# Patient Record
Sex: Female | Born: 1935 | Race: White | Hispanic: No | Marital: Married | Smoking: Never smoker
Health system: Southern US, Community
[De-identification: ages and names within clinical notes are randomized; demographics above are authoritative.]

## PROBLEM LIST (undated history)

## (undated) DIAGNOSIS — T8859XA Other complications of anesthesia, initial encounter: Secondary | ICD-10-CM

## (undated) DIAGNOSIS — T4145XA Adverse effect of unspecified anesthetic, initial encounter: Secondary | ICD-10-CM

## (undated) DIAGNOSIS — R112 Nausea with vomiting, unspecified: Secondary | ICD-10-CM

## (undated) DIAGNOSIS — M1990 Unspecified osteoarthritis, unspecified site: Secondary | ICD-10-CM

## (undated) DIAGNOSIS — Z9889 Other specified postprocedural states: Secondary | ICD-10-CM

## (undated) HISTORY — PX: CHOLECYSTECTOMY: SHX55

## (undated) HISTORY — PX: ABDOMINAL HYSTERECTOMY: SHX81

## (undated) HISTORY — PX: KNEE ARTHROSCOPY: SUR90

## (undated) HISTORY — PX: SHOULDER ACROMIOPLASTY: SHX6093

## (undated) HISTORY — PX: APPENDECTOMY: SHX54

---

## 1999-04-26 ENCOUNTER — Encounter: Admission: RE | Admit: 1999-04-26 | Discharge: 1999-04-26 | Payer: Self-pay | Admitting: Family Medicine

## 1999-04-26 ENCOUNTER — Encounter: Payer: Self-pay | Admitting: Family Medicine

## 1999-05-15 ENCOUNTER — Encounter: Admission: RE | Admit: 1999-05-15 | Discharge: 1999-05-15 | Payer: Self-pay | Admitting: Family Medicine

## 1999-05-15 ENCOUNTER — Encounter: Payer: Self-pay | Admitting: Family Medicine

## 2000-05-18 ENCOUNTER — Encounter: Admission: RE | Admit: 2000-05-18 | Discharge: 2000-05-18 | Payer: Self-pay | Admitting: Family Medicine

## 2000-05-18 ENCOUNTER — Encounter: Payer: Self-pay | Admitting: Family Medicine

## 2001-05-12 ENCOUNTER — Encounter: Admission: RE | Admit: 2001-05-12 | Discharge: 2001-05-12 | Payer: Self-pay | Admitting: Family Medicine

## 2001-05-12 ENCOUNTER — Encounter: Payer: Self-pay | Admitting: Family Medicine

## 2002-03-31 ENCOUNTER — Observation Stay (HOSPITAL_COMMUNITY): Admission: RE | Admit: 2002-03-31 | Discharge: 2002-04-01 | Payer: Self-pay | Admitting: Orthopedic Surgery

## 2002-06-10 ENCOUNTER — Encounter: Admission: RE | Admit: 2002-06-10 | Discharge: 2002-06-10 | Payer: Self-pay | Admitting: Family Medicine

## 2002-06-10 ENCOUNTER — Encounter: Payer: Self-pay | Admitting: Family Medicine

## 2002-06-23 ENCOUNTER — Encounter: Admission: RE | Admit: 2002-06-23 | Discharge: 2002-06-23 | Payer: Self-pay | Admitting: Family Medicine

## 2002-06-23 ENCOUNTER — Encounter: Payer: Self-pay | Admitting: Family Medicine

## 2002-06-28 ENCOUNTER — Encounter: Payer: Self-pay | Admitting: Family Medicine

## 2002-06-28 ENCOUNTER — Encounter: Admission: RE | Admit: 2002-06-28 | Discharge: 2002-06-28 | Payer: Self-pay | Admitting: Family Medicine

## 2003-07-10 ENCOUNTER — Encounter: Admission: RE | Admit: 2003-07-10 | Discharge: 2003-07-10 | Payer: Self-pay | Admitting: Family Medicine

## 2004-06-03 ENCOUNTER — Encounter: Admission: RE | Admit: 2004-06-03 | Discharge: 2004-06-03 | Payer: Self-pay | Admitting: Family Medicine

## 2004-07-22 ENCOUNTER — Encounter: Admission: RE | Admit: 2004-07-22 | Discharge: 2004-07-22 | Payer: Self-pay | Admitting: Family Medicine

## 2005-07-25 ENCOUNTER — Encounter: Admission: RE | Admit: 2005-07-25 | Discharge: 2005-07-25 | Payer: Self-pay | Admitting: Family Medicine

## 2006-09-01 ENCOUNTER — Encounter: Admission: RE | Admit: 2006-09-01 | Discharge: 2006-09-01 | Payer: Self-pay | Admitting: Family Medicine

## 2007-09-06 ENCOUNTER — Encounter: Admission: RE | Admit: 2007-09-06 | Discharge: 2007-09-06 | Payer: Self-pay | Admitting: Family Medicine

## 2007-09-14 ENCOUNTER — Encounter: Admission: RE | Admit: 2007-09-14 | Discharge: 2007-09-14 | Payer: Self-pay | Admitting: Family Medicine

## 2008-03-14 ENCOUNTER — Encounter: Admission: RE | Admit: 2008-03-14 | Discharge: 2008-03-14 | Payer: Self-pay | Admitting: Family Medicine

## 2008-09-12 ENCOUNTER — Encounter: Admission: RE | Admit: 2008-09-12 | Discharge: 2008-09-12 | Payer: Self-pay | Admitting: Family Medicine

## 2009-09-17 ENCOUNTER — Encounter: Admission: RE | Admit: 2009-09-17 | Discharge: 2009-09-17 | Payer: Self-pay | Admitting: Family Medicine

## 2010-07-26 NOTE — Op Note (Signed)
   Megan [name], Megan [name]                           ACCOUNT NO.:  [id number]   MEDICAL RECORD NO.:  [id number]                   PATIENT TYPE:  AMB   LOCATION:  DAY                                  FACILITY:  [facility]   PHYSICIAN:  Ronald A. Gioffre, M.D.             DATE OF BIRTH:  1927-10-25   DATE OF PROCEDURE:  03/31/2002  DATE OF DISCHARGE:                                 OPERATIVE REPORT   PREOPERATIVE DIAGNOSES:  1. Tear of the rotator cuff tendon on the left.  2. Severe impingement syndrome, left shoulder.   POSTOPERATIVE DIAGNOSES:  1. Tear of the rotator cuff tendon on the left.  2. Severe impingement syndrome, left shoulder.   PROCEDURES:  1. Open decompression by performing an acromionectomy and acromioplasty of     the left shoulder.  2. Repair of a small tear in the rotator cuff tendon, left shoulder.   SURGEON:  Georges Lynch. Darrelyn Hillock, M.D.   ASSISTANT:  Ebbie Ridge. Paitsel, P.A.   DESCRIPTION OF PROCEDURE:  Under general anesthesia, prior to the general  anesthesia the patient was given an interscalene block in the left side of  her neck in the holding area.  She was then taken back to surgery and under  general anesthesia, a routine orthopedic prep and draping of the left  shoulder was carried out.  At this time an incision was made over the  anterior aspect of the left shoulder, bleeders identified and cauterized.  I  then split the deltoid tendon and muscle by sharp dissection.  I excised the  subdeltoid bursa.  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  At this  time I noted a small hole in the rotator cuff tendon.  I protected the  tendon with the Bennett retractor and did an acromionectomy and  acromioplasty by utilizing the oscillating saw and the bur.  She had severe  overgrowth of her acromion, which was embedding down into the rotator cuff.  After this was done, we had a nice clear, open space.  I then put a few  sutures in the rotator cuff, repaired the  cuff, thoroughly irrigated out the  shoulder, then reapproximated the tendon and muscle in the usual fashion.  The subcu was closed with 0 Vicryl, the skin with metal staples, and a  sterile Neosporin dressing was applied.  She was then placed in a sling.                                               Ronald A. Darrelyn Hillock, M.D.    RAG/MEDQ  D:  03/31/2002  T:  03/31/2002  Job:  [id number]

## 2010-09-06 ENCOUNTER — Other Ambulatory Visit: Payer: Self-pay | Admitting: Family Medicine

## 2010-09-06 DIAGNOSIS — Z1231 Encounter for screening mammogram for malignant neoplasm of breast: Secondary | ICD-10-CM

## 2010-09-24 ENCOUNTER — Ambulatory Visit
Admission: RE | Admit: 2010-09-24 | Discharge: 2010-09-24 | Disposition: A | Discharge status: Home / Self Care | Payer: Medicare Other | Source: Ambulatory Visit | Attending: Family Medicine | Admitting: Family Medicine

## 2010-09-24 DIAGNOSIS — Z1231 Encounter for screening mammogram for malignant neoplasm of breast: Secondary | ICD-10-CM

## 2011-09-01 ENCOUNTER — Other Ambulatory Visit: Payer: Self-pay | Admitting: Family Medicine

## 2011-09-01 DIAGNOSIS — Z1231 Encounter for screening mammogram for malignant neoplasm of breast: Secondary | ICD-10-CM

## 2011-09-25 ENCOUNTER — Ambulatory Visit
Admission: RE | Admit: 2011-09-25 | Discharge: 2011-09-25 | Disposition: A | Discharge status: Home / Self Care | Payer: Medicare Other | Source: Ambulatory Visit | Attending: Family Medicine | Admitting: Family Medicine

## 2011-09-25 DIAGNOSIS — Z1231 Encounter for screening mammogram for malignant neoplasm of breast: Secondary | ICD-10-CM

## 2012-09-01 ENCOUNTER — Other Ambulatory Visit: Payer: Self-pay

## 2012-09-01 DIAGNOSIS — Z1231 Encounter for screening mammogram for malignant neoplasm of breast: Secondary | ICD-10-CM

## 2012-09-21 ENCOUNTER — Other Ambulatory Visit: Payer: Self-pay | Admitting: Family Medicine

## 2012-09-21 DIAGNOSIS — R1310 Dysphagia, unspecified: Secondary | ICD-10-CM

## 2012-09-22 ENCOUNTER — Ambulatory Visit
Admission: RE | Admit: 2012-09-22 | Discharge: 2012-09-22 | Disposition: A | Discharge status: Home / Self Care | Payer: Self-pay | Source: Ambulatory Visit | Attending: Family Medicine | Admitting: Family Medicine

## 2012-09-22 DIAGNOSIS — R1310 Dysphagia, unspecified: Secondary | ICD-10-CM

## 2012-09-28 ENCOUNTER — Ambulatory Visit
Admission: RE | Admit: 2012-09-28 | Discharge: 2012-09-28 | Disposition: A | Discharge status: Home / Self Care | Payer: Medicare Other | Source: Ambulatory Visit

## 2012-09-28 DIAGNOSIS — Z1231 Encounter for screening mammogram for malignant neoplasm of breast: Secondary | ICD-10-CM

## 2012-09-29 ENCOUNTER — Other Ambulatory Visit: Payer: Self-pay | Admitting: Family Medicine

## 2012-09-29 DIAGNOSIS — R928 Other abnormal and inconclusive findings on diagnostic imaging of breast: Secondary | ICD-10-CM

## 2012-10-14 ENCOUNTER — Ambulatory Visit
Admission: RE | Admit: 2012-10-14 | Discharge: 2012-10-14 | Disposition: A | Discharge status: Home / Self Care | Payer: Medicare Other | Source: Ambulatory Visit | Attending: Family Medicine | Admitting: Family Medicine

## 2012-10-14 DIAGNOSIS — R928 Other abnormal and inconclusive findings on diagnostic imaging of breast: Secondary | ICD-10-CM

## 2013-03-18 ENCOUNTER — Other Ambulatory Visit: Payer: Self-pay | Admitting: Family Medicine

## 2013-03-18 DIAGNOSIS — N63 Unspecified lump in unspecified breast: Secondary | ICD-10-CM

## 2013-04-19 ENCOUNTER — Ambulatory Visit
Admission: RE | Admit: 2013-04-19 | Discharge: 2013-04-19 | Disposition: A | Discharge status: Home / Self Care | Payer: Medicare Other | Source: Ambulatory Visit | Attending: Family Medicine | Admitting: Family Medicine

## 2013-04-19 ENCOUNTER — Ambulatory Visit
Admission: RE | Admit: 2013-04-19 | Discharge: 2013-04-19 | Disposition: A | Discharge status: Home / Self Care | Payer: Self-pay | Source: Ambulatory Visit | Attending: Family Medicine | Admitting: Family Medicine

## 2013-04-19 DIAGNOSIS — N63 Unspecified lump in unspecified breast: Secondary | ICD-10-CM

## 2013-09-15 ENCOUNTER — Other Ambulatory Visit: Payer: Self-pay | Admitting: Family Medicine

## 2013-09-15 DIAGNOSIS — N63 Unspecified lump in unspecified breast: Secondary | ICD-10-CM

## 2013-10-06 ENCOUNTER — Ambulatory Visit
Admission: RE | Admit: 2013-10-06 | Discharge: 2013-10-06 | Disposition: A | Discharge status: Home / Self Care | Payer: Medicare Other | Source: Ambulatory Visit | Attending: Family Medicine | Admitting: Family Medicine

## 2013-10-06 ENCOUNTER — Other Ambulatory Visit: Payer: Self-pay | Admitting: Family Medicine

## 2013-10-06 DIAGNOSIS — N63 Unspecified lump in unspecified breast: Secondary | ICD-10-CM

## 2014-10-04 ENCOUNTER — Other Ambulatory Visit: Payer: Self-pay

## 2014-10-04 DIAGNOSIS — Z1231 Encounter for screening mammogram for malignant neoplasm of breast: Secondary | ICD-10-CM

## 2014-10-12 ENCOUNTER — Ambulatory Visit: Payer: Self-pay

## 2014-11-14 ENCOUNTER — Ambulatory Visit: Payer: Self-pay

## 2014-11-14 ENCOUNTER — Ambulatory Visit
Admission: RE | Admit: 2014-11-14 | Discharge: 2014-11-14 | Disposition: A | Discharge status: Home / Self Care | Payer: Medicare Other | Source: Ambulatory Visit

## 2014-11-14 ENCOUNTER — Other Ambulatory Visit: Payer: Self-pay

## 2014-11-14 DIAGNOSIS — Z1231 Encounter for screening mammogram for malignant neoplasm of breast: Secondary | ICD-10-CM

## 2014-11-15 ENCOUNTER — Other Ambulatory Visit: Payer: Self-pay | Admitting: Family Medicine

## 2014-11-15 DIAGNOSIS — R928 Other abnormal and inconclusive findings on diagnostic imaging of breast: Secondary | ICD-10-CM

## 2014-11-21 ENCOUNTER — Ambulatory Visit
Admission: RE | Admit: 2014-11-21 | Discharge: 2014-11-21 | Disposition: A | Discharge status: Home / Self Care | Payer: Medicare Other | Source: Ambulatory Visit | Attending: Family Medicine | Admitting: Family Medicine

## 2014-11-21 DIAGNOSIS — R928 Other abnormal and inconclusive findings on diagnostic imaging of breast: Secondary | ICD-10-CM

## 2015-10-23 ENCOUNTER — Other Ambulatory Visit: Payer: Self-pay | Admitting: Family Medicine

## 2015-10-23 DIAGNOSIS — Z1231 Encounter for screening mammogram for malignant neoplasm of breast: Secondary | ICD-10-CM

## 2015-11-19 ENCOUNTER — Ambulatory Visit
Admission: RE | Admit: 2015-11-19 | Discharge: 2015-11-19 | Disposition: A | Discharge status: Home / Self Care | Payer: Medicare Other | Source: Ambulatory Visit | Attending: Family Medicine | Admitting: Family Medicine

## 2015-11-19 DIAGNOSIS — Z1231 Encounter for screening mammogram for malignant neoplasm of breast: Secondary | ICD-10-CM

## 2016-10-28 ENCOUNTER — Other Ambulatory Visit: Payer: Self-pay | Admitting: Family Medicine

## 2016-10-28 DIAGNOSIS — Z1231 Encounter for screening mammogram for malignant neoplasm of breast: Secondary | ICD-10-CM

## 2016-11-24 ENCOUNTER — Ambulatory Visit
Admission: RE | Admit: 2016-11-24 | Discharge: 2016-11-24 | Disposition: A | Discharge status: Home / Self Care | Payer: Medicare Other | Source: Ambulatory Visit | Attending: Family Medicine | Admitting: Family Medicine

## 2016-11-24 DIAGNOSIS — Z1231 Encounter for screening mammogram for malignant neoplasm of breast: Secondary | ICD-10-CM

## 2017-07-07 ENCOUNTER — Other Ambulatory Visit: Payer: Self-pay | Admitting: Orthopedic Surgery

## 2017-07-07 DIAGNOSIS — M48061 Spinal stenosis, lumbar region without neurogenic claudication: Secondary | ICD-10-CM

## 2017-07-07 NOTE — Progress Notes (Signed)
Phone call to patient to verify medication list and allergies for myelogram procedure. Pt informed she will not need to stop any medications prior to myelogram appointment. Pt verbalized understanding.

## 2017-07-15 ENCOUNTER — Ambulatory Visit
Admission: RE | Admit: 2017-07-15 | Discharge: 2017-07-15 | Disposition: A | Discharge status: Home / Self Care | Payer: Medicare Other | Source: Ambulatory Visit | Attending: Orthopedic Surgery | Admitting: Orthopedic Surgery

## 2017-07-15 DIAGNOSIS — M48061 Spinal stenosis, lumbar region without neurogenic claudication: Secondary | ICD-10-CM

## 2017-07-15 MED ORDER — IOPAMIDOL (ISOVUE-M 200) INJECTION 41%
15.0000 mL | Freq: Once | INTRAMUSCULAR | Status: AC
  Administered 2017-07-15: 15 mL via INTRATHECAL

## 2017-07-15 MED ORDER — DIAZEPAM 5 MG PO TABS
5.0000 mg | ORAL_TABLET | Freq: Once | ORAL | Status: AC
  Administered 2017-07-15: 5 mg via ORAL

## 2017-07-15 NOTE — Discharge Instructions (Signed)

## 2017-08-26 NOTE — Patient Instructions (Addendum)
Megan [name]  08/26/2017   Your procedure is scheduled on: 09-02-17   Report to [facility] Main  Entrance    Report to admitting at 5:30 AM    Call this number if you have problems the morning of surgery [phone]   Remember: Do not eat food or drink liquids :After Midnight.     Take these medicines the morning of surgery with A SIP OF WATER: None                                 You may not have any metal on your body including hair pins and              piercings  Do not wear jewelry, make-up, lotions, powders or perfumes, deodorant             Do not wear nail polish.  Do not shave  48 hours prior to surgery.                 Do not bring valuables to the hospital. [place] [facility] IS NOT             RESPONSIBLE   FOR VALUABLES.  Contacts, dentures or bridgework may not be worn into surgery.  Leave suitcase in the car. After surgery it may be brought to your room.   :  Special Instructions: N/A              Please read over the following fact sheets you were given: _____________________________________________________________________             [facility] Preparing [facility] Before surgery, you can play an important role.  Because skin is not sterile, your skin needs to be as free of germs as possible.  You can reduce the number of germs on your skin by washing with CHG (chlorahexidine gluconate) soap before surgery.  CHG is an antiseptic cleaner which kills germs and bonds with the skin to continue killing germs even after washing. Please DO NOT use if you have an allergy to CHG or antibacterial soaps.  If your skin becomes reddened/irritated stop using the CHG and inform your nurse when you arrive at Short Stay. Do not shave (including legs and underarms) for at least 48 hours prior to the first CHG shower.  You may shave your face/neck. Please follow these instructions carefully:  1.  Shower with CHG Soap the night before surgery and the   morning of Surgery.  2.  If you choose to wash your hair, wash your hair first as usual with your  normal  shampoo.  3.  After you shampoo, rinse your hair and body thoroughly to remove the  shampoo.                           4.  Use CHG as you would any other liquid soap.  You can apply chg directly  to the skin and wash                       Gently with a scrungie or clean washcloth.  5.  Apply the CHG Soap to your body ONLY FROM THE NECK DOWN.   Do not use on face/ open  Wound or open sores. Avoid contact with eyes, ears mouth and genitals (private parts).                       Wash face,  Genitals (private parts) with your normal soap.             6.  Wash thoroughly, paying special attention to the area where your surgery  will be performed.  7.  Thoroughly rinse your body with warm water from the neck down.  8.  DO NOT shower/wash with your normal soap after using and rinsing off  the CHG Soap.                9.  Pat yourself dry with a clean towel.            10.  Wear clean pajamas.            11.  Place clean sheets on your bed the night of your first shower and do not  sleep with pets. Day of Surgery : Do not apply any lotions/deodorants the morning of surgery.  Please wear clean clothes to the hospital/surgery center.  FAILURE TO FOLLOW THESE INSTRUCTIONS MAY RESULT IN THE CANCELLATION OF YOUR SURGERY PATIENT SIGNATURE_________________________________  NURSE SIGNATURE__________________________________  ________________________________________________________________________   Megan [name]  An incentive spirometer is a tool that can help keep your lungs clear and active. This tool measures how well you are filling your lungs with each breath. Taking long deep breaths may help reverse or decrease the chance of developing breathing (pulmonary) problems (especially infection) following:  A long period of time when you are unable to move or be  active. BEFORE THE PROCEDURE   If the spirometer includes an indicator to show your best effort, your nurse or respiratory therapist will set it to a desired goal.  If possible, sit up straight or lean slightly forward. Try not to slouch.  Hold the incentive spirometer in an upright position. INSTRUCTIONS FOR USE  1. Sit on the edge of your bed if possible, or sit up as far as you can in bed or on a chair. 2. Hold the incentive spirometer in an upright position. 3. Breathe out normally. 4. Place the mouthpiece in your mouth and seal your lips tightly around it. 5. Breathe in slowly and as deeply as possible, raising the piston or the ball toward the top of the column. 6. Hold your breath for 3-5 seconds or for as long as possible. Allow the piston or ball to fall to the bottom of the column. 7. Remove the mouthpiece from your mouth and breathe out normally. 8. Rest for a few seconds and repeat Steps 1 through 7 at least 10 times every 1-2 hours when you are awake. Take your time and take a few normal breaths between deep breaths. 9. The spirometer may include an indicator to show your best effort. Use the indicator as a goal to work toward during each repetition. 10. After each set of 10 deep breaths, practice coughing to be sure your lungs are clear. If you have an incision (the cut made at the time of surgery), support your incision when coughing by placing a pillow or rolled up towels firmly against it. Once you are able to get out of bed, walk around indoors and cough well. You may stop using the incentive spirometer when instructed by your caregiver.  RISKS AND COMPLICATIONS  Take your time so you do not get  dizzy or light-headed.  If you are in pain, you may need to take or ask for pain medication before doing incentive spirometry. It is harder to take a deep breath if you are having pain. AFTER USE  Rest and breathe slowly and easily.  It can be helpful to keep track of a log of  your progress. Your caregiver can provide you with a simple table to help with this. If you are using the spirometer at home, follow these instructions: [place] IF:   You are having difficultly using the spirometer.  You have trouble using the spirometer as often as instructed.  Your pain medication is not giving enough relief while using the spirometer.  You develop fever of 100.5 F (38.1 C) or higher. SEEK IMMEDIATE MEDICAL CARE IF:   You cough up bloody sputum that had not been present before.  You develop fever of 102 F (38.9 C) or greater.  You develop worsening pain at or near the incision site. MAKE SURE YOU:   Understand these instructions.  Will watch your condition.  Will get help right away if you are not doing well or get worse. Document Released: 07/07/2006 Document Revised: 05/19/2011 Document Reviewed: 09/07/2006 Exit[employer] Patient Information 2014 Exit[employer], [place].   ________________________________________________________________________  WHAT IS A BLOOD TRANSFUSION? Blood Transfusion Information  A transfusion is the replacement of blood or some of its parts. Blood is made up of multiple cells which provide different functions.  Red blood cells carry oxygen and are used for blood loss replacement.  White blood cells fight against infection.  Platelets control bleeding.  Plasma helps clot blood.  Other blood products are available for specialized needs, such as hemophilia or other clotting disorders. BEFORE THE TRANSFUSION  Who gives blood for transfusions?   Healthy volunteers who are fully evaluated to make sure their blood is safe. This is blood bank blood. Transfusion therapy is the safest it has ever been in the practice of medicine. Before blood is taken from a donor, a complete history is taken to make sure that person has no history of diseases nor engages in risky social behavior (examples are intravenous drug use or sexual activity  with multiple partners). The donor's travel history is screened to minimize risk of transmitting infections, such as malaria. The donated blood is tested for signs of infectious diseases, such as HIV and hepatitis. The blood is then tested to be sure it is compatible with you in order to minimize the chance of a transfusion reaction. If you or a relative donates blood, this is often done in anticipation of surgery and is not appropriate for emergency situations. It takes many days to process the donated blood. RISKS AND COMPLICATIONS Although transfusion therapy is very safe and saves many lives, the main dangers of transfusion include:   Getting an infectious disease.  Developing a transfusion reaction. This is an allergic reaction to something in the blood you were given. Every precaution is taken to prevent this. The decision to have a blood transfusion has been considered carefully by your caregiver before blood is given. Blood is not given unless the benefits outweigh the risks. AFTER THE TRANSFUSION  Right after receiving a blood transfusion, you will usually feel much better and more energetic. This is especially true if your red blood cells have gotten low (anemic). The transfusion raises the level of the red blood cells which carry oxygen, and this usually causes an energy increase.  The nurse administering the transfusion will  monitor you carefully for complications. HOME CARE INSTRUCTIONS  No special instructions are needed after a transfusion. You may find your energy is better. Speak with your caregiver about any limitations on activity for underlying diseases you may have. SEEK MEDICAL CARE IF:   Your condition is not improving after your transfusion.  You develop redness or irritation at the intravenous (IV) site. SEEK IMMEDIATE MEDICAL CARE IF:  Any of the following symptoms occur over the next 12 hours:  Shaking chills.  You have a temperature by mouth above 102 F (38.9  C), not controlled by medicine.  Chest, back, or muscle pain.  People around you feel you are not acting correctly or are confused.  Shortness of breath or difficulty breathing.  Dizziness and fainting.  You get a rash or develop hives.  You have a decrease in urine output.  Your urine turns a dark color or changes to pink, red, or brown. Any of the following symptoms occur over the next 10 days:  You have a temperature by mouth above 102 F (38.9 C), not controlled by medicine.  Shortness of breath.  Weakness after normal activity.  The white part of the eye turns yellow (jaundice).  You have a decrease in the amount of urine or are urinating less often.  Your urine turns a dark color or changes to pink, red, or brown. Document Released: 02/22/2000 Document Revised: 05/19/2011 Document Reviewed: 10/11/2007 [facility] Patient Information 2014 [place], [place].  _______________________________________________________________________

## 2017-08-28 ENCOUNTER — Encounter (HOSPITAL_COMMUNITY): Payer: Self-pay

## 2017-08-28 ENCOUNTER — Encounter (HOSPITAL_COMMUNITY)
Admission: RE | Admit: 2017-08-28 | Discharge: 2017-08-28 | Disposition: A | Discharge status: Home / Self Care | Payer: Medicare Other | Source: Ambulatory Visit | Attending: Orthopedic Surgery | Admitting: Orthopedic Surgery

## 2017-08-28 ENCOUNTER — Other Ambulatory Visit: Payer: Self-pay

## 2017-08-28 ENCOUNTER — Ambulatory Visit (HOSPITAL_COMMUNITY)
Admission: RE | Admit: 2017-08-28 | Discharge: 2017-08-28 | Disposition: A | Discharge status: Home / Self Care | Payer: Medicare Other | Source: Ambulatory Visit | Attending: Surgical | Admitting: Surgical

## 2017-08-28 DIAGNOSIS — I728 Aneurysm of other specified arteries: Secondary | ICD-10-CM | POA: Diagnosis not present

## 2017-08-28 DIAGNOSIS — M4316 Spondylolisthesis, lumbar region: Secondary | ICD-10-CM | POA: Diagnosis not present

## 2017-08-28 DIAGNOSIS — M4186 Other forms of scoliosis, lumbar region: Secondary | ICD-10-CM | POA: Insufficient documentation

## 2017-08-28 DIAGNOSIS — M545 Low back pain, unspecified: Secondary | ICD-10-CM

## 2017-08-28 DIAGNOSIS — M47816 Spondylosis without myelopathy or radiculopathy, lumbar region: Secondary | ICD-10-CM | POA: Diagnosis not present

## 2017-08-28 HISTORY — DX: Adverse effect of unspecified anesthetic, initial encounter: T41.45XA

## 2017-08-28 HISTORY — DX: Nausea with vomiting, unspecified: R11.2

## 2017-08-28 HISTORY — DX: Other complications of anesthesia, initial encounter: T88.59XA

## 2017-08-28 HISTORY — DX: Unspecified osteoarthritis, unspecified site: M19.90

## 2017-08-28 HISTORY — DX: Other specified postprocedural states: Z98.890

## 2017-08-28 LAB — CBC WITH DIFFERENTIAL/PLATELET
Basophils Absolute: 0 10*3/uL (ref 0.0–0.1)
Basophils Relative: 0 %
Eosinophils Absolute: 0.2 10*3/uL (ref 0.0–0.7)
Eosinophils Relative: 2 %
HCT: 42.8 % (ref 36.0–46.0)
Hemoglobin: 14.6 g/dL (ref 12.0–15.0)
Lymphocytes Relative: 16 %
Lymphs Abs: 2.2 10*3/uL (ref 0.7–4.0)
MCH: 31.3 pg (ref 26.0–34.0)
MCHC: 34.1 g/dL (ref 30.0–36.0)
MCV: 91.8 fL (ref 78.0–100.0)
Monocytes Absolute: 1.5 10*3/uL — ABNORMAL HIGH (ref 0.1–1.0)
Monocytes Relative: 11 %
Neutro Abs: 10 10*3/uL — ABNORMAL HIGH (ref 1.7–7.7)
Neutrophils Relative %: 71 %
Platelets: 195 10*3/uL (ref 150–400)
RBC: 4.66 MIL/uL (ref 3.87–5.11)
RDW: 13.7 % (ref 11.5–15.5)
WBC: 13.9 10*3/uL — ABNORMAL HIGH (ref 4.0–10.5)

## 2017-08-28 LAB — COMPREHENSIVE METABOLIC PANEL
ALT: 13 U/L — ABNORMAL LOW (ref 14–54)
AST: 16 U/L (ref 15–41)
Albumin: 3.7 g/dL (ref 3.5–5.0)
Alkaline Phosphatase: 49 U/L (ref 38–126)
Anion gap: 7 (ref 5–15)
BUN: 12 mg/dL (ref 6–20)
CO2: 29 mmol/L (ref 22–32)
Calcium: 9.2 mg/dL (ref 8.9–10.3)
Chloride: 107 mmol/L (ref 101–111)
Creatinine, Ser: 0.68 mg/dL (ref 0.44–1.00)
GFR calc Af Amer: >60 mL/min (ref >60)
GFR calc non Af Amer: >60 mL/min (ref >60)
Glucose, Bld: 106 mg/dL — ABNORMAL HIGH (ref 65–99)
Potassium: 4.6 mmol/L (ref 3.5–5.1)
Sodium: 143 mmol/L (ref 135–145)
Total Bilirubin: 0.9 mg/dL (ref 0.3–1.2)
Total Protein: 6.5 g/dL (ref 6.5–8.1)

## 2017-08-28 LAB — SURGICAL PCR SCREEN
MRSA, PCR: NEGATIVE
STAPHYLOCOCCUS AUREUS: NEGATIVE

## 2017-08-28 LAB — ABO/RH: ABO/RH(D): O POS

## 2017-08-28 LAB — PROTIME-INR
INR: 1.05
Prothrombin Time: 13.6 seconds (ref 11.4–15.2)

## 2017-08-28 LAB — APTT: aPTT: 28 seconds (ref 24–36)

## 2017-08-28 NOTE — Progress Notes (Signed)
08-28-17 Back X-ray routed to Dr. Darrelyn HillockGioffre for review.

## 2017-08-28 NOTE — Progress Notes (Signed)
07-27-17 Surgical clearance on chart from Dr. Valentina LucksGriffin

## 2017-08-31 ENCOUNTER — Other Ambulatory Visit: Payer: Self-pay | Admitting: Orthopedic Surgery

## 2017-08-31 DIAGNOSIS — I728 Aneurysm of other specified arteries: Secondary | ICD-10-CM

## 2017-08-31 NOTE — Progress Notes (Addendum)
Spoke to Dr. 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 Dr. Broadus JohnWarren would like patient to be seen by Vascular prior to surgery. Contacted Dr. Jeannetta EllisGioffre's office and left a voice message for [place] regarding Dr. Isaac BlissWarren's recommendation.   Spoke to [place] at Dr. Jeannetta EllisGioffre's office regarding the need for Vascular Clearance. Kelly verbalized understanding.

## 2017-09-01 ENCOUNTER — Ambulatory Visit
Admission: RE | Admit: 2017-09-01 | Discharge: 2017-09-01 | Disposition: A | Discharge status: Home / Self Care | Payer: Medicare Other | Source: Ambulatory Visit | Attending: Orthopedic Surgery | Admitting: Orthopedic Surgery

## 2017-09-01 ENCOUNTER — Other Ambulatory Visit: Payer: Self-pay | Admitting: Orthopedic Surgery

## 2017-09-01 DIAGNOSIS — I728 Aneurysm of other specified arteries: Secondary | ICD-10-CM

## 2017-09-01 MED ORDER — GADOBENATE DIMEGLUMINE 529 MG/ML IV SOLN
16.0000 mL | Freq: Once | INTRAVENOUS | Status: AC | PRN
  Administered 2017-09-01: 16 mL via INTRAVENOUS

## 2017-09-01 MED ORDER — BUPIVACAINE LIPOSOME 1.3 % IJ SUSP
20.0000 mL | Freq: Once | INTRAMUSCULAR | Status: DC
  Filled 2017-09-01: qty 20

## 2017-09-01 NOTE — Anesthesia Preprocedure Evaluation (Signed)
Anesthesia Evaluation  Patient identified by MRN, date of birth, ID band Patient awake    Reviewed: Allergy & Precautions, H&P , NPO status , Patient's Chart, lab work & pertinent test results, reviewed documented beta blocker date and time   History of Anesthesia Complications (+) PONV and history of anesthetic complications  Airway Mallampati: II  TM Distance: >3 FB Neck ROM: full    Dental no notable dental hx.    Pulmonary    Pulmonary exam normal breath sounds clear to auscultation       Cardiovascular Exercise Tolerance: Good negative cardio ROS   Rhythm:regular Rate:Normal     Neuro/Psych    GI/Hepatic   Endo/Other    Renal/GU   negative genitourinary   Musculoskeletal  (+) Arthritis , Osteoarthritis,    Abdominal   Peds  Hematology   Anesthesia Other Findings   Reproductive/Obstetrics negative OB ROS                             Anesthesia Physical Anesthesia Plan  ASA: III  Anesthesia Plan: General   Post-op Pain Management:    Induction:   PONV Risk Score and Plan: 3 and Dexamethasone, Ondansetron and Treatment may vary due to age or medical condition  Airway Management Planned: Oral ETT  Additional Equipment:   Intra-op Plan:   Post-operative Plan:   Informed Consent: I have reviewed the patients History and Physical, chart, labs and discussed the procedure including the risks, benefits and alternatives for the proposed anesthesia with the patient or authorized representative who has indicated his/her understanding and acceptance.   Dental Advisory Given  Plan Discussed with: CRNA, Anesthesiologist and Surgeon  Anesthesia Plan Comments:         Anesthesia Quick Evaluation

## 2017-09-02 ENCOUNTER — Ambulatory Visit (HOSPITAL_COMMUNITY): Payer: Medicare Other | Admitting: Anesthesiology

## 2017-09-02 ENCOUNTER — Ambulatory Visit (HOSPITAL_COMMUNITY): Payer: Medicare Other

## 2017-09-02 ENCOUNTER — Observation Stay (HOSPITAL_COMMUNITY)
Admission: RE | Admit: 2017-09-02 | Discharge: 2017-09-03 | Disposition: A | Discharge status: Home / Self Care | Payer: Medicare Other | Source: Ambulatory Visit | Attending: Orthopedic Surgery | Admitting: Orthopedic Surgery

## 2017-09-02 ENCOUNTER — Encounter (HOSPITAL_COMMUNITY): Payer: Self-pay | Admitting: Emergency Medicine

## 2017-09-02 ENCOUNTER — Other Ambulatory Visit: Payer: Self-pay

## 2017-09-02 ENCOUNTER — Encounter (HOSPITAL_COMMUNITY)
Admission: RE | Disposition: A | Discharge status: Home / Self Care | Payer: Self-pay | Source: Ambulatory Visit | Attending: Orthopedic Surgery

## 2017-09-02 DIAGNOSIS — R262 Difficulty in walking, not elsewhere classified: Secondary | ICD-10-CM | POA: Insufficient documentation

## 2017-09-02 DIAGNOSIS — R531 Weakness: Secondary | ICD-10-CM | POA: Insufficient documentation

## 2017-09-02 DIAGNOSIS — M4317 Spondylolisthesis, lumbosacral region: Principal | ICD-10-CM | POA: Insufficient documentation

## 2017-09-02 DIAGNOSIS — I771 Stricture of artery: Secondary | ICD-10-CM | POA: Insufficient documentation

## 2017-09-02 DIAGNOSIS — M48062 Spinal stenosis, lumbar region with neurogenic claudication: Secondary | ICD-10-CM | POA: Diagnosis present

## 2017-09-02 DIAGNOSIS — Z419 Encounter for procedure for purposes other than remedying health state, unspecified: Secondary | ICD-10-CM

## 2017-09-02 DIAGNOSIS — Z7982 Long term (current) use of aspirin: Secondary | ICD-10-CM | POA: Insufficient documentation

## 2017-09-02 DIAGNOSIS — I728 Aneurysm of other specified arteries: Secondary | ICD-10-CM | POA: Insufficient documentation

## 2017-09-02 DIAGNOSIS — M4807 Spinal stenosis, lumbosacral region: Secondary | ICD-10-CM | POA: Insufficient documentation

## 2017-09-02 HISTORY — PX: LUMBAR LAMINECTOMY/DECOMPRESSION MICRODISCECTOMY: SHX5026

## 2017-09-02 LAB — TYPE AND SCREEN
ABO/RH(D): O POS
Antibody Screen: NEGATIVE

## 2017-09-02 SURGERY — LUMBAR LAMINECTOMY/DECOMPRESSION MICRODISCECTOMY 1 LEVEL
Anesthesia: General

## 2017-09-02 MED ORDER — LACTATED RINGERS IV SOLN
INTRAVENOUS | Status: DC
  Administered 2017-09-03: 07:00:00 via INTRAVENOUS

## 2017-09-02 MED ORDER — ESMOLOL HCL 100 MG/10ML IV SOLN
INTRAVENOUS | Status: AC
  Filled 2017-09-02: qty 10

## 2017-09-02 MED ORDER — ESMOLOL HCL 100 MG/10ML IV SOLN
INTRAVENOUS | Status: DC | PRN
  Administered 2017-09-02 (×2): 10 mg via INTRAVENOUS

## 2017-09-02 MED ORDER — FLEET ENEMA 7-19 GM/118ML RE ENEM: 1.0000 | ENEMA | Freq: Once | RECTAL | Status: DC | PRN

## 2017-09-02 MED ORDER — FENTANYL CITRATE (PF) 100 MCG/2ML IJ SOLN
25.0000 ug | INTRAMUSCULAR | Status: DC | PRN
  Administered 2017-09-02 (×4): 25 ug via INTRAVENOUS

## 2017-09-02 MED ORDER — ROCURONIUM BROMIDE 10 MG/ML (PF) SYRINGE
PREFILLED_SYRINGE | INTRAVENOUS | Status: DC | PRN
  Administered 2017-09-02: 5 mg via INTRAVENOUS
  Administered 2017-09-02: 10 mg via INTRAVENOUS
  Administered 2017-09-02: 40 mg via INTRAVENOUS
  Administered 2017-09-02: 5 mg via INTRAVENOUS
  Administered 2017-09-02: 10 mg via INTRAVENOUS

## 2017-09-02 MED ORDER — EPHEDRINE SULFATE-NACL 50-0.9 MG/10ML-% IV SOSY
PREFILLED_SYRINGE | INTRAVENOUS | Status: DC | PRN
  Administered 2017-09-02 (×4): 5 mg via INTRAVENOUS

## 2017-09-02 MED ORDER — CEFAZOLIN SODIUM-DEXTROSE 2-4 GM/100ML-% IV SOLN
2.0000 g | INTRAVENOUS | Status: AC
  Administered 2017-09-02: 2 g via INTRAVENOUS
  Filled 2017-09-02: qty 100

## 2017-09-02 MED ORDER — BACITRACIN-NEOMYCIN-POLYMYXIN 400-5-5000 EX OINT
TOPICAL_OINTMENT | CUTANEOUS | Status: DC | PRN
  Administered 2017-09-02: 1 via TOPICAL

## 2017-09-02 MED ORDER — EPHEDRINE 5 MG/ML INJ
INTRAVENOUS | Status: AC
  Filled 2017-09-02: qty 10

## 2017-09-02 MED ORDER — ONDANSETRON HCL 4 MG/2ML IJ SOLN
INTRAMUSCULAR | Status: DC | PRN
  Administered 2017-09-02: 4 mg via INTRAVENOUS

## 2017-09-02 MED ORDER — METHOCARBAMOL 1000 MG/10ML IJ SOLN
500.0000 mg | Freq: Four times a day (QID) | INTRAVENOUS | Status: DC | PRN
  Administered 2017-09-02: 500 mg via INTRAVENOUS
  Filled 2017-09-02: qty 550

## 2017-09-02 MED ORDER — LACTATED RINGERS IV SOLN
INTRAVENOUS | Status: DC
  Administered 2017-09-02 (×2): via INTRAVENOUS

## 2017-09-02 MED ORDER — CHLORHEXIDINE GLUCONATE 4 % EX LIQD: 60.0000 mL | Freq: Once | CUTANEOUS | Status: DC

## 2017-09-02 MED ORDER — HYDROMORPHONE HCL 1 MG/ML IJ SOLN: 0.5000 mg | INTRAMUSCULAR | Status: DC | PRN

## 2017-09-02 MED ORDER — PHENOL 1.4 % MT LIQD
1.0000 | OROMUCOSAL | Status: DC | PRN
  Filled 2017-09-02: qty 177

## 2017-09-02 MED ORDER — THROMBIN (RECOMBINANT) 5000 UNITS EX SOLR
CUTANEOUS | Status: AC
  Filled 2017-09-02: qty 10000

## 2017-09-02 MED ORDER — METHOCARBAMOL 500 MG PO TABS: 500.0000 mg | ORAL_TABLET | Freq: Four times a day (QID) | ORAL | Status: DC | PRN

## 2017-09-02 MED ORDER — ONDANSETRON HCL 4 MG/2ML IJ SOLN: 4.0000 mg | Freq: Four times a day (QID) | INTRAMUSCULAR | Status: DC | PRN

## 2017-09-02 MED ORDER — POLYMYXIN B SULFATE 500000 UNITS IJ SOLR
INTRAMUSCULAR | Status: AC
  Filled 2017-09-02: qty 500000

## 2017-09-02 MED ORDER — SODIUM CHLORIDE 0.9 % IV SOLN
INTRAVENOUS | Status: DC | PRN
  Administered 2017-09-02: 500 mL

## 2017-09-02 MED ORDER — BUPIVACAINE LIPOSOME 1.3 % IJ SUSP
INTRAMUSCULAR | Status: DC | PRN
  Administered 2017-09-02: 20 mL

## 2017-09-02 MED ORDER — PHENYLEPHRINE 40 MCG/ML (10ML) SYRINGE FOR IV PUSH (FOR BLOOD PRESSURE SUPPORT)
PREFILLED_SYRINGE | INTRAVENOUS | Status: DC | PRN
  Administered 2017-09-02 (×4): 80 ug via INTRAVENOUS

## 2017-09-02 MED ORDER — ACETAMINOPHEN 650 MG RE SUPP: 650.0000 mg | RECTAL | Status: DC | PRN

## 2017-09-02 MED ORDER — FENTANYL CITRATE (PF) 100 MCG/2ML IJ SOLN
INTRAMUSCULAR | Status: DC | PRN
  Administered 2017-09-02: 25 ug via INTRAVENOUS
  Administered 2017-09-02: 75 ug via INTRAVENOUS

## 2017-09-02 MED ORDER — BACITRACIN-NEOMYCIN-POLYMYXIN 400-5-5000 EX OINT
TOPICAL_OINTMENT | CUTANEOUS | Status: AC
  Filled 2017-09-02: qty 1

## 2017-09-02 MED ORDER — BUPIVACAINE-EPINEPHRINE 0.5% -1:200000 IJ SOLN
INTRAMUSCULAR | Status: DC | PRN
  Administered 2017-09-02: 20 mL

## 2017-09-02 MED ORDER — FENTANYL CITRATE (PF) 100 MCG/2ML IJ SOLN
INTRAMUSCULAR | Status: AC
  Administered 2017-09-02: 25 ug via INTRAVENOUS
  Filled 2017-09-02: qty 2

## 2017-09-02 MED ORDER — HYDROCODONE-ACETAMINOPHEN 10-325 MG PO TABS: 2.0000 | ORAL_TABLET | ORAL | Status: DC | PRN

## 2017-09-02 MED ORDER — ONDANSETRON HCL 4 MG PO TABS: 4.0000 mg | ORAL_TABLET | Freq: Four times a day (QID) | ORAL | Status: DC | PRN

## 2017-09-02 MED ORDER — BISACODYL 5 MG PO TBEC: 5.0000 mg | DELAYED_RELEASE_TABLET | Freq: Every day | ORAL | Status: DC | PRN

## 2017-09-02 MED ORDER — SUGAMMADEX SODIUM 500 MG/5ML IV SOLN
INTRAVENOUS | Status: DC | PRN
  Administered 2017-09-02: 300 mg via INTRAVENOUS

## 2017-09-02 MED ORDER — ROCURONIUM BROMIDE 100 MG/10ML IV SOLN
INTRAVENOUS | Status: AC
  Filled 2017-09-02: qty 2

## 2017-09-02 MED ORDER — FENTANYL CITRATE (PF) 100 MCG/2ML IJ SOLN
INTRAMUSCULAR | Status: AC
  Filled 2017-09-02: qty 2

## 2017-09-02 MED ORDER — LIDOCAINE 2% (20 MG/ML) 5 ML SYRINGE
INTRAMUSCULAR | Status: DC | PRN
  Administered 2017-09-02: 80 mg via INTRAVENOUS

## 2017-09-02 MED ORDER — CEFAZOLIN SODIUM-DEXTROSE 1-4 GM/50ML-% IV SOLN
1.0000 g | Freq: Three times a day (TID) | INTRAVENOUS | Status: AC
  Administered 2017-09-02 – 2017-09-03 (×3): 1 g via INTRAVENOUS
  Filled 2017-09-02 (×3): qty 50

## 2017-09-02 MED ORDER — MENTHOL 3 MG MT LOZG: 1.0000 | LOZENGE | OROMUCOSAL | Status: DC | PRN

## 2017-09-02 MED ORDER — ACETAMINOPHEN 325 MG PO TABS: 650.0000 mg | ORAL_TABLET | ORAL | Status: DC | PRN

## 2017-09-02 MED ORDER — MEPERIDINE HCL 50 MG/ML IJ SOLN: 6.2500 mg | INTRAMUSCULAR | Status: DC | PRN

## 2017-09-02 MED ORDER — POLYETHYLENE GLYCOL 3350 17 G PO PACK: 17.0000 g | PACK | Freq: Every day | ORAL | Status: DC | PRN

## 2017-09-02 MED ORDER — BUPIVACAINE-EPINEPHRINE (PF) 0.5% -1:200000 IJ SOLN
INTRAMUSCULAR | Status: AC
  Filled 2017-09-02: qty 30

## 2017-09-02 MED ORDER — HYDROCODONE-ACETAMINOPHEN 5-325 MG PO TABS
1.0000 | ORAL_TABLET | ORAL | Status: DC | PRN
  Administered 2017-09-02 – 2017-09-03 (×2): 1 via ORAL
  Filled 2017-09-02 (×2): qty 1

## 2017-09-02 MED ORDER — PROPOFOL 10 MG/ML IV BOLUS
INTRAVENOUS | Status: AC
  Filled 2017-09-02: qty 40

## 2017-09-02 MED ORDER — DEXAMETHASONE SODIUM PHOSPHATE 10 MG/ML IJ SOLN
INTRAMUSCULAR | Status: DC | PRN
  Administered 2017-09-02: 10 mg via INTRAVENOUS

## 2017-09-02 MED ORDER — PROPOFOL 10 MG/ML IV BOLUS
INTRAVENOUS | Status: DC | PRN
  Administered 2017-09-02: 100 mg via INTRAVENOUS

## 2017-09-02 MED ORDER — PHENYLEPHRINE 40 MCG/ML (10ML) SYRINGE FOR IV PUSH (FOR BLOOD PRESSURE SUPPORT)
PREFILLED_SYRINGE | INTRAVENOUS | Status: AC
  Filled 2017-09-02: qty 10

## 2017-09-02 MED ORDER — SUGAMMADEX SODIUM 200 MG/2ML IV SOLN
INTRAVENOUS | Status: AC
  Filled 2017-09-02: qty 4

## 2017-09-02 SURGICAL SUPPLY — 58 items
AGENT HMST SPONGE THK3/8 (HEMOSTASIS) ×1
BAG DECANTER FOR FLEXI CONT (MISCELLANEOUS) ×2 IMPLANT
BAG SPEC THK2 15X12 ZIP CLS (MISCELLANEOUS)
BAG ZIPLOCK 12X15 (MISCELLANEOUS) IMPLANT
CLEANER TIP ELECTROSURG 2X2 (MISCELLANEOUS) ×3 IMPLANT
CLOSURE WOUND 1/2 X4 (GAUZE/BANDAGES/DRESSINGS) ×1
COVER SURGICAL LIGHT HANDLE (MISCELLANEOUS) ×3 IMPLANT
DRAPE MICROSCOPE LEICA (MISCELLANEOUS) ×3 IMPLANT
DRAPE POUCH INSTRU U-SHP 10X18 (DRAPES) ×3 IMPLANT
DRAPE SHEET LG 3/4 BI-LAMINATE (DRAPES) ×3 IMPLANT
DRAPE SURG 17X11 SM STRL (DRAPES) ×3 IMPLANT
DRSG ADAPTIC 3X8 NADH LF (GAUZE/BANDAGES/DRESSINGS) ×3 IMPLANT
DRSG PAD ABDOMINAL 8X10 ST (GAUZE/BANDAGES/DRESSINGS) ×12 IMPLANT
DURAPREP 26ML APPLICATOR (WOUND CARE) ×3 IMPLANT
ELECT BLADE TIP CTD 4 INCH (ELECTRODE) ×3 IMPLANT
ELECT REM PT RETURN 15FT ADLT (MISCELLANEOUS) ×3 IMPLANT
GAUZE SPONGE 4X4 12PLY STRL (GAUZE/BANDAGES/DRESSINGS) ×3 IMPLANT
GLOVE BIOGEL M 7.0 STRL (GLOVE) ×2 IMPLANT
GLOVE BIOGEL PI IND STRL 6.5 (GLOVE) IMPLANT
GLOVE BIOGEL PI IND STRL 7.5 (GLOVE) IMPLANT
GLOVE BIOGEL PI IND STRL 8.5 (GLOVE) ×1 IMPLANT
GLOVE BIOGEL PI INDICATOR 6.5 (GLOVE) ×2
GLOVE BIOGEL PI INDICATOR 7.5 (GLOVE) ×2
GLOVE BIOGEL PI INDICATOR 8.5 (GLOVE) ×2
GLOVE ECLIPSE 8.0 STRL XLNG CF (GLOVE) ×3 IMPLANT
GLOVE SURG SS PI 6.0 STRL IVOR (GLOVE) ×2 IMPLANT
GLOVE SURG SS PI 7.5 STRL IVOR (GLOVE) ×2 IMPLANT
GOWN STRL REUS W/TWL LRG LVL3 (GOWN DISPOSABLE) ×2 IMPLANT
GOWN STRL REUS W/TWL XL LVL3 (GOWN DISPOSABLE) ×6 IMPLANT
HEMOSTAT SPONGE AVITENE ULTRA (HEMOSTASIS) ×3 IMPLANT
KIT BASIN OR (CUSTOM PROCEDURE TRAY) ×3 IMPLANT
KIT POSITIONING SURG ANDREWS (MISCELLANEOUS) IMPLANT
MANIFOLD NEPTUNE II (INSTRUMENTS) ×3 IMPLANT
MARKER SKIN DUAL TIP RULER LAB (MISCELLANEOUS) ×3 IMPLANT
NDL SPNL 18GX3.5 QUINCKE PK (NEEDLE) ×2 IMPLANT
NEEDLE HYPO 22GX1.5 SAFETY (NEEDLE) ×3 IMPLANT
NEEDLE SPNL 18GX3.5 QUINCKE PK (NEEDLE) ×6 IMPLANT
PACK LAMINECTOMY ORTHO (CUSTOM PROCEDURE TRAY) ×3 IMPLANT
PAD ABD 8X10 STRL (GAUZE/BANDAGES/DRESSINGS) ×2 IMPLANT
PATTIES SURGICAL .5 X.5 (GAUZE/BANDAGES/DRESSINGS) IMPLANT
PATTIES SURGICAL .75X.75 (GAUZE/BANDAGES/DRESSINGS) ×3 IMPLANT
PATTIES SURGICAL 1X1 (DISPOSABLE) ×3 IMPLANT
RUBBERBAND STERILE (MISCELLANEOUS) ×3 IMPLANT
SPONGE LAP 4X18 RFD (DISPOSABLE) ×6 IMPLANT
STAPLER VISISTAT 35W (STAPLE) ×3 IMPLANT
STRIP CLOSURE SKIN 1/2X4 (GAUZE/BANDAGES/DRESSINGS) ×2 IMPLANT
SUT VIC AB 0 CT1 27 (SUTURE) ×3
SUT VIC AB 0 CT1 27XBRD ANTBC (SUTURE) ×1 IMPLANT
SUT VIC AB 1 CT1 27 (SUTURE) ×9
SUT VIC AB 1 CT1 27XBRD ANTBC (SUTURE) ×3 IMPLANT
SUT VIC AB 2-0 CT1 27 (SUTURE) ×6
SUT VIC AB 2-0 CT1 TAPERPNT 27 (SUTURE) IMPLANT
SYR 10ML LL (SYRINGE) ×2 IMPLANT
SYR 20CC LL (SYRINGE) ×6 IMPLANT
TAPE CLOTH SURG 4X10 WHT LF (GAUZE/BANDAGES/DRESSINGS) ×2 IMPLANT
TOWEL OR 17X26 10 PK STRL BLUE (TOWEL DISPOSABLE) ×3 IMPLANT
TOWEL OR NON WOVEN STRL DISP B (DISPOSABLE) ×2 IMPLANT
TRAY FOLEY CATH 14FRSI W/METER (CATHETERS) ×2 IMPLANT

## 2017-09-02 NOTE — Anesthesia Postprocedure Evaluation (Signed)
Anesthesia Post Note  Patient: Megan [name]  Procedure(s) Performed: Central decompression lumbar laminectomy L5-S1 (N/A )     Patient location during evaluation: PACU Anesthesia Type: General Level of consciousness: awake and alert Pain management: pain level controlled Vital Signs Assessment: post-procedure vital signs reviewed and stable Respiratory status: spontaneous breathing, nonlabored ventilation, respiratory function stable and patient connected to nasal cannula oxygen Cardiovascular status: blood pressure returned to baseline and stable Postop Assessment: no apparent nausea or vomiting Anesthetic complications: no    Last Vitals:  Vitals:   09/02/17 1222 09/02/17 1246  BP: 134/75 (!) 156/77  Pulse: 78 85  Resp: 14 18  Temp: 36.9 C 36.9 C  SpO2: 98% 97%    Last Pain:  Vitals:   09/02/17 1300  TempSrc:   PainSc: 0-No pain                 Sharlie Shreffler

## 2017-09-02 NOTE — Op Note (Signed)
NAMJeralyn [name]: [name], Megan B. MEDICAL RECORD [id number] ACCOUNT [id number] DATE OF BIRTH:March 20, 1927 FACILITY: [facility] LOCATION: [facility] PHYSICIAN:Katye Valek A. [place], MD  OPERATIVE REPORT  DATE OF PROCEDURE:  09/02/2017  PREOPERATIVE DIAGNOSES: 1.  Grade II spondylolisthesis at L5-S1. 2.  Foraminal stenosis for the L5 root. 3.  Foraminal stenosis involving the S1 root. 4.  All of her pain was mainly right lower extremity with some weakness.  POSTOPERATIVE DIAGNOSES:   1.  Grade II spondylolisthesis at L5-S1. 2.  Foraminal stenosis for the L5 root. 3.  Foraminal stenosis involving the S1 root. 4.  All of her pain was mainly right lower extremity with some weakness.  OPERATION:    1.  Central decompressive complete lumbar laminectomy for L5-S1 for severe spinal stenosis. 2.  Foraminotomy for the L5 root. 3.  Foraminotomy for the S1 root.  SURGEON:  Ranee Gosselinonald Ayanah Snader, MD  ASSISTANT:  Dimitri PedAmber Constable, PA  DESCRIPTION OF PROCEDURE:  Under general anesthesia, the patient put on a spinal frame.  A routine orthopedic prep and draping of the lower back was carried out.  At this time, the appropriate timeout was first carried out.  I did not need to mark the  back in the holding area because we went centrally to the right and left.  At this particular time, 2 needles were placed in the back for localization purposes.  X-ray was taken.  Once we verified the position, another x-ray was taken later.  We then  went down and first of all injected 20 mL of 0.5% Marcaine with epinephrine in the subQ to control the bleeding.  We then made the incision over L5-S1.  Bleeders identified and cauterized.  She had 2 grams of IV Ancef.  The incision was carried down  through to the fascia.  Self-retaining retractors were inserted.  We then separated the muscle from the lamina and spinous processes bilaterally and went down to identify the sacrum and then worked up proximally.  We then cleared the soft tissue off of   the lamina.  Another x-ray was taken later.  At this time, the self-retaining McCullough retractors were inserted.  I then carried out my decompression by removing the spinous process of L5.  We then brought the microscope in and completed the  dissection.  I went proximally and distally with the 2 mm and 3 mm Kerrisons.  We then carefully protected the dura.  We utilized some cottonoids to place between the dura and the ligamentum flavum.  A nerve hook was utilized to lift up the ligamentum  flavum.  I then went down and removed out distally.  We did a hemilaminectomy of S1.  The foramina was cleared as well.  We did a nice foraminotomy of S1.  We then went up along the lateral recess decompressed the recess and went up proximally to do a  foraminotomy for the root above as well.  When the procedure was done, the dura now free.  We had no major bleeding.  We thoroughly irrigated out the area and loosely applied some Gelfoam and closed the wound in layers in usual fashion except we left a  small distal and proximal deep part of the wound open for drainage purposes.  At the end of the procedure, we injected 20 mL of Exparel into the soft tissue.  The remaining part of the wound was closed in the usual fashion.  Skin was closed with metal  staples.  Sterile dressings were applied.  The patient will be admitted  overnight for pain control.  There was a question of a celiac artery aneurysm preop.  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  This did not interfere with the surgery.  A Foley catheter was inserted preop and removed postop.  TN/NUANCE  D:09/02/2017 T:09/02/2017 JOB:[id number]

## 2017-09-02 NOTE — Evaluation (Signed)
Physical Therapy Evaluation Patient Details Name: Megan [name] MRN: [id number] DOB: [birth year] Today's Date: 09/02/2017   History of Present Illness  Pt s/p L5-S1 central decompression  Clinical Impression  Pt s/p back surgery and presents with functional mobility limitations 2* post op pain and back precautions.  Pt should progress to dc home with family assist.   Follow Up Recommendations No PT follow up    Equipment Recommendations  Rolling walker with 5" wheels    Recommendations for Other Services OT consult     Precautions / Restrictions Precautions Precautions: Back Restrictions Weight Bearing Restrictions: No      Mobility  Bed Mobility Overal bed mobility: Needs Assistance Bed Mobility: Supine to Sit     Supine to sit: Min guard     General bed mobility comments: cues for correct log roll technique and adherence to back precautions  Transfers Overall transfer level: Needs assistance Equipment used: None Transfers: Sit to/from Stand Sit to Stand: Min guard         General transfer comment: cues for transition position, adherence to back precautions and use of UEs to self assist  Ambulation/Gait Ambulation/Gait assistance: Min assist;Min guard Gait Distance (Feet): 300 Feet Assistive device: Rolling walker (2 wheeled);None Gait Pattern/deviations: Step-through pattern;Shuffle;Trunk flexed Gait velocity: decr   General Gait Details: 200' with RW and 100' sans AD - pt with increased instability sans AD and intermittently reaching to steady  Information systems managertairs            Wheelchair Mobility    Modified Rankin (Stroke Patients Only)       Balance Overall balance assessment: Needs assistance Sitting-balance support: No upper extremity supported;Feet supported Sitting balance-Leahy Scale: Good     Standing balance support: No upper extremity supported Standing balance-Leahy Scale: Fair                               Pertinent  Vitals/Pain Pain Assessment: 0-10 Pain Score: 3  Pain Location: back Pain Descriptors / Indicators: Aching;Sore Pain Intervention(s): Limited activity within patient's tolerance;Monitored during session;Premedicated before session    Home Living Family/patient expects to be discharged to:: Private residence Living Arrangements: Alone Available Help at Discharge: Family Type of Home: House Home Access: Ramped entrance     Home Layout: One level        Prior Function Level of Independence: Independent               Hand Dominance        Extremity/Trunk Assessment   Upper Extremity Assessment Upper Extremity Assessment: Overall WFL for tasks assessed    Lower Extremity Assessment Lower Extremity Assessment: Overall WFL for tasks assessed       Communication   Communication: No difficulties  Cognition Arousal/Alertness: Awake/alert Behavior During Therapy: WFL for tasks assessed/performed Overall Cognitive Status: Within Functional Limits for tasks assessed                                        General Comments      Exercises     Assessment/Plan    PT Assessment Patient needs continued PT services  PT Problem List Decreased strength;Decreased range of motion;Decreased activity tolerance;Decreased mobility;Decreased knowledge of use of DME;Pain;Decreased knowledge of precautions       PT Treatment Interventions DME instruction;Gait training;Stair training;Functional mobility training;Therapeutic activities;Therapeutic exercise;Patient/family education  PT Goals (Current goals can be found in the Care Plan section)  Acute Rehab PT Goals Patient Stated Goal: Regain IND PT Goal Formulation: With patient Time For Goal Achievement: 09/05/17 Potential to Achieve Goals: Good    Frequency 7X/week   Barriers to discharge        Co-evaluation               AM-PAC PT "6 Clicks" Daily Activity  Outcome Measure Difficulty  turning over in bed (including adjusting bedclothes, sheets and blankets)?: A Lot Difficulty moving from lying on back to sitting on the side of the bed? : A Lot Difficulty sitting down on and standing up from a chair with arms (e.g., wheelchair, bedside commode, etc,.)?: A Lot Help needed moving to and from a bed to chair (including a wheelchair)?: A Little Help needed walking in hospital room?: A Little Help needed climbing 3-5 steps with a railing? : A Little 6 Click Score: 15    End of Session Equipment Utilized During Treatment: Gait belt Activity Tolerance: Patient tolerated treatment well Patient left: in chair;with call bell/phone within reach;with family/visitor present Nurse Communication: Mobility status PT Visit Diagnosis: Difficulty in walking, not elsewhere classified (R26.2)    Time: 1610-9604 PT Time Calculation (min) (ACUTE ONLY): 28 min   Charges:   PT Evaluation $PT Eval Low Complexity: 1 Low PT Treatments $Gait Training: 8-22 mins   PT G Codes:        Pg [phone]   Megan [name] 09/02/2017, 5:57 PM

## 2017-09-02 NOTE — Interval H&P Note (Signed)
History and Physical Interval Note:  09/02/2017 7:09 AM  Megan [name]  has presented today for surgery, with the diagnosis of lumbar spinal stenosis  The various methods of treatment have been discussed with the patient and family. After consideration of risks, benefits and other options for treatment, the patient has consented to  Procedure(s): Central decompression lumbar laminectomy L5-S1 (N/A) as a surgical intervention .  The patient's history has been reviewed, patient examined, no change in status, stable for surgery.  I have reviewed the patient's chart and labs.  Questions were answered to the patient's satisfaction.     Ranee Gosselinonald Kolbee Bogusz

## 2017-09-02 NOTE — Anesthesia Procedure Notes (Signed)
Procedure Name: Intubation Date/Time: 09/02/2017 7:35 AM Performed by: Lavina Hamman, CRNA Pre-anesthesia Checklist: Patient identified, Emergency Drugs available, Suction available, Patient being monitored and Timeout performed Patient Re-evaluated:Patient Re-evaluated prior to induction Oxygen Delivery Method: Circle system utilized Preoxygenation: Pre-oxygenation with 100% oxygen Induction Type: IV induction Ventilation: Mask ventilation without difficulty Laryngoscope Size: Mac and 4 Grade View: Grade I Tube type: Oral Tube size: 7.5 mm Number of attempts: 1 Airway Equipment and Method: Stylet Placement Confirmation: ETT inserted through vocal cords under direct vision,  positive ETCO2,  CO2 detector and breath sounds checked- equal and bilateral Secured at: 21 cm Tube secured with: Tape Dental Injury: Teeth and Oropharynx as per pre-operative assessment

## 2017-09-02 NOTE — Brief Op Note (Signed)
09/02/2017  8:56 AM  PATIENT:  Megan [name]  84 y.o. female  PRE-OPERATIVE DIAGNOSIS:  lumbar spinal stenosis at L-5-S-1 ,Severe.Foraminal Stenosis involving the L-5 and S-1 Nerve Roots,Severe.Spondylolysthesis of L-5-S-1,Grade 2  POST-OPERATIVE DIAGNOSIS: Same as Pre-Op  PROCEDURE:  Procedure(s): Central decompression lumbar laminectomy L5-S1 (N/A)at L-5-S-1 for Severe Spinal Stenosis and Foraminotomies of L-5 and S-1 nerve Roots for severe Foraminal Stenosis.  SURGEON:  Surgeon(s) and Role:    * Ranee GosselinGioffre, Johan Antonacci, MD - Primary  PHYSICIAN ASSISTANT: Dimitri PedAmber Constable PA  ASSISTANTS: Dimitri PedAmber Constable PA  ANESTHESIA:   general  EBL:  50 mL   BLOOD ADMINISTERED:none  DRAINS: none   LOCAL MEDICATIONS USED:  MARCAINE 20cc of 0.50% with Epinephrine  at the start of the case and 20cc of Exparel at the end of the case.    SPECIMEN:  No Specimen  DISPOSITION OF SPECIMEN:  N/A  COUNTS:  YES  TOURNIQUET:  * No tourniquets in log *  DICTATION: .Other Dictation: Dictation Number [id number]  PLAN OF CARE: Admit for overnight observation  PATIENT DISPOSITION:  PACU - guarded condition.   Delay start of Pharmacological VTE agent (>24hrs) due to surgical blood loss or risk of bleeding: yes

## 2017-09-02 NOTE — Plan of Care (Signed)
Plan of care 

## 2017-09-02 NOTE — Discharge Instructions (Addendum)
For the first three days, remove your dressing, and tape a piece of saran wrap over your incision °Take your shower, then remove the saran wrap and put a clean dressing on, then reapply your sling. °After three days you can shower without the saran wrap.  °No lifting or excessive bending °No driving while taking pain medications. °Call Dr. Gioffre if any wound complications or temperature of 101 degrees F or over.  °Call the office for an appointment to see Dr. Gioffre in two weeks: [phone] and ask for Dr. Gioffre's nurse, Tammy Johnson. °

## 2017-09-02 NOTE — Transfer of Care (Signed)
Immediate Anesthesia Transfer of Care Note  Patient: Megan [name]  Procedure(s) Performed: Procedure(s): Central decompression lumbar laminectomy L5-S1 (N/A)  Patient Location: PACU  Anesthesia Type:General  Level of Consciousness:  sedated, patient cooperative and responds to stimulation  Airway & Oxygen Therapy:Patient Spontanous Breathing and Patient connected to face mask oxgen  Post-op Assessment:  Report given to PACU RN and Post -op Vital signs reviewed and stable  Post vital signs:  Reviewed and stable  Last Vitals:  Vitals:   09/02/17 0945 09/02/17 1000  BP: 137/75 (!) 151/92  Pulse: 83 84  Resp: 18 18  Temp:    SpO2: 100% 99%    Complications: No apparent anesthesia complications

## 2017-09-02 NOTE — H&P (Signed)
Megan [name] is an 84 y.o. female.   Chief Complaint: Back and Right Leg pain. HPI: Progressive right Leg pain  Past Medical History:  Diagnosis Date  . Arthritis   . Complication of anesthesia   . PONV (postoperative nausea and vomiting)     Past Surgical History:  Procedure Laterality Date  . ABDOMINAL HYSTERECTOMY    . APPENDECTOMY    . CHOLECYSTECTOMY    . KNEE ARTHROSCOPY Right   . SHOULDER ACROMIOPLASTY     Spur    Family History  Problem Relation Age of Onset  . Breast cancer Neg Hx    Social History:  reports that she has never smoked. She has never used smokeless tobacco. She reports that she has current or past drug history. She reports that she does not drink alcohol.  Allergies: No Known Allergies  Medications Prior to Admission  Medication Sig Dispense Refill  . acetaminophen (TYLENOL) 500 MG tablet Take 1,000 mg by mouth 3 (three) times daily as needed for moderate pain or headache.    Marland Kitchen. aspirin EC 81 MG tablet Take 81 mg by mouth daily with lunch.     . Calcium-Vitamin D-Vitamin K (CALCIUM + D + K PO) Take 1 tablet by mouth daily with breakfast.    . Polyethyl Glycol-Propyl Glycol (SYSTANE OP) Place 1 drop into both eyes daily.    . vitamin B-12 (CYANOCOBALAMIN) 1000 MCG tablet Take 1,000 mcg by mouth daily with lunch.       No results found for this or any previous visit (from the past 48 hour(s)). 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 Assess for visceral aneurysms. EXAM: MRA ABDOMEN AND PELVIS WITH CONTRAST TECHNIQUE: Multiplanar, multiecho pulse sequences of the abdomen and pelvis were obtained with intravenous contrast. Angiographic images of abdomen and pelvis were obtained using MRA technique with intravenous contrast. CONTRAST:  16mL MULTIHANCE GADOBENATE DIMEGLUMINE 529 MG/ML IV SOLN COMPARISON:  07/15/2017, 08/28/2017 FINDINGS: ARTERIAL Aorta: Aorta  is mildly ectatic and tortuous with minor atherosclerosis. No occlusive process, aneurysm, dissection or acute vascular process. Celiac axis: Patent origin without evidence of aneurysm. Celiac branches are also visualized proximally and patent. 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 Splenic artery remains patent and markedly tortuous. Superior mesenteric: Patent origin including its branches throughout the mesentery Left renal:           Widely patent Right renal:          Widely patent Inferior mesenteric:  Remains patent off the distal aorta Left iliac: Left common, internal and external iliac arteries remain patent. No iliac occlusion or inflow disease. Right iliac: Right common, internal and external iliac arteries remain patent. No right inflow disease. VENOUS No veno-occlusive process. NONVASCULAR Lower chest: Large hiatal hernia noted. Normal heart size. No pericardial pleural effusion. Minor dependent basilar atelectasis. Hepatobiliary: No masses or other significant abnormality. Pancreas: No mass, inflammatory changes, or other significant abnormality. Spleen: Within normal limits in size and appearance. Adrenals/Urinary Tract: No masses identified. No evidence of hydronephrosis. Stomach/Bowel: Negative for bowel obstruction, significant dilatation, ileus, or free air. Large duodenal diverticulum noted in the left abdomen containing air which creates artifact. Lymphatic: No pathologically enlarged lymph nodes. Reproductive: Not imaged by this exam Other: None. Musculoskeletal: No suspicious bone lesions identified. 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 Negative for celiac aneurysm. 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 No acute intra-abdominal finding. Electronically Signed   By: Judie Petit.  Shick M.D.   On: 09/01/2017 16:55    Review of Systems  Constitutional: Negative.   HENT: Negative.   Eyes: Negative.   Respiratory: Negative.   Cardiovascular: Negative.   Gastrointestinal: Negative.   Genitourinary: Negative.   Musculoskeletal: Positive for back pain.  Skin: Negative.   Neurological: Positive for focal weakness.  Endo/Heme/Allergies: Negative.   Psychiatric/Behavioral: Negative.     Blood pressure (!) 152/73, pulse 85, temperature 98.4 F (36.9 C), temperature source Oral, resp. rate 18, height 5\' 9"  (1.753 m), weight 80.3 kg (177 lb), SpO2 97 %. Physical Exam  Constitutional: She appears well-developed.  HENT:  Head: Normocephalic.  Eyes: Pupils are equal, round, and reactive to light.  Neck: Normal range of motion.  Cardiovascular: Normal rate.  Respiratory: Effort normal.  GI: Soft.  Musculoskeletal: She exhibits tenderness.  Neurological:  Weakness of Right Lower.  Skin: Skin is warm.  Psychiatric: She has a normal mood and affect.     Assessment/Plan Central Decompressive Lumbar laminectomy at L-5-S-1 for Spinal Stenosis.  Ranee Gosselin, MD 09/02/2017, 7:04 AM

## 2017-09-02 NOTE — Progress Notes (Addendum)
Dr. Tacy Duraddono notified of Xray and MRA results.  States we are okay to proceed.

## 2017-09-03 DIAGNOSIS — M4317 Spondylolisthesis, lumbosacral region: Secondary | ICD-10-CM | POA: Diagnosis not present

## 2017-09-03 MED ORDER — HYDROCODONE-ACETAMINOPHEN 5-325 MG PO TABS: 1.0000 | ORAL_TABLET | ORAL | 0 refills | Status: AC | PRN

## 2017-09-03 MED ORDER — METHOCARBAMOL 500 MG PO TABS: 500.0000 mg | ORAL_TABLET | Freq: Four times a day (QID) | ORAL | 0 refills | Status: AC | PRN

## 2017-09-03 NOTE — Progress Notes (Signed)
   Subjective: 1 Day Post-Op Procedure(s) (LRB): Central decompression lumbar laminectomy L5-S1 (N/A) Patient reports pain as mild.   Patient seen in rounds with Dr. Darrelyn HillockGioffre. Patient is well, and has had no acute complaints or problems. Mild low back soreness but no leg pain. No numbness or tingling in the LE. Voiding and positive flatus. No issues overnight.  Plan is to go Home after hospital stay.  Objective: Vital signs in last 24 hours: Temp:  [97.6 F (36.4 C)-98.5 F (36.9 C)] 97.6 F (36.4 C) (06/27 0526) Pulse Rate:  [73-92] 76 (06/27 0526) Resp:  [14-19] 16 (06/27 0526) BP: (119-156)/(65-92) 119/73 (06/27 0526) SpO2:  [94 %-100 %] 95 % (06/27 0526) Weight:  [80.3 kg (177 lb)] 80.3 kg (177 lb) (06/26 1246)  Intake/Output from previous day:  Intake/Output Summary (Last 24 hours) at 09/03/2017 0727 Last data filed at 09/03/2017 0600 Gross per 24 hour  Intake 3833.33 ml  Output 650 ml  Net 3183.33 ml     EXAM General - Patient is Alert and Oriented Extremity - Neurologically intact Intact pulses distally Dorsiflexion/Plantar flexion intact Dressing - dressing C/D/I Motor Function - intact, moving foot and toes well on exam.    Past Medical History:  Diagnosis Date  . Arthritis   . Complication of anesthesia   . PONV (postoperative nausea and vomiting)     Assessment/Plan: 1 Day Post-Op Procedure(s) (LRB): Central decompression lumbar laminectomy L5-S1 (N/A) Active Problems:   Spinal stenosis, lumbar region with neurogenic claudication  Estimated body mass index is 26.14 kg/m as calculated from the following:   Height as of this encounter: 5\' 9"  (1.753 m).   Weight as of this encounter: 80.3 kg (177 lb). Advance diet Up with therapy D/C IV fluids when tolerating POs well  DVT Prophylaxis - Aspirin Weight-Bearing as tolerated D/C O2 and Pulse OX and try on Room Air  Plan for DC home today pending progress. Follow up in office in 2 weeks.   Dimitri PedAmber  Alyzah Pelly, PA-C Orthopaedic Surgery 09/03/2017, 7:27 AM

## 2017-09-03 NOTE — Progress Notes (Signed)
Physical Therapy Treatment Patient Details Name: Megan [name] MRN: [id number] DOB: [birth year] Today's Date: 09/03/2017    History of Present Illness Pt s/p L5-S1 central decompression    PT Comments    Pt motivated and progressing steadily with mobility.  Pt reviewed back precautions, ambulated in hall and negotiated stairs.  Pt eager for dc home.   Follow Up Recommendations  No PT follow up     Equipment Recommendations  Rolling walker with 5" wheels    Recommendations for Other Services OT consult     Precautions / Restrictions Precautions Precautions: Back Precaution Booklet Issued: Yes (comment) Precaution Comments: pt recalls 2/3 back precautions without cues - reviewed all Restrictions Weight Bearing Restrictions: No    Mobility  Bed Mobility Overal bed mobility: Needs Assistance Bed Mobility: Supine to Sit     Supine to sit: Supervision     General bed mobility comments: cues for correct log roll technique and adherence to back precautions  Transfers Overall transfer level: Needs assistance Equipment used: None Transfers: Sit to/from Stand Sit to Stand: Min guard;Supervision         General transfer comment: cues for transition position, adherence to back precautions and use of UEs to self assist  Ambulation/Gait Ambulation/Gait assistance: Min guard;Supervision Gait Distance (Feet): 450 Feet Assistive device: Rolling walker (2 wheeled);None Gait Pattern/deviations: Step-through pattern;Shuffle;Trunk flexed Gait velocity: decr   General Gait Details: 300 with RW and 150' sans AD - pt with increased instability sans AD and intermittently reaching to steady   Stairs Stairs: Yes Stairs assistance: Min guard Stair Management: One rail Left;Forwards;Step to pattern Number of Stairs: 2 General stair comments: steady assist   Wheelchair Mobility    Modified Rankin (Stroke Patients Only)       Balance Overall balance assessment: Needs  assistance Sitting-balance support: No upper extremity supported;Feet supported Sitting balance-Leahy Scale: Good     Standing balance support: No upper extremity supported Standing balance-Leahy Scale: Fair                              Cognition Arousal/Alertness: Awake/alert Behavior During Therapy: WFL for tasks assessed/performed Overall Cognitive Status: Within Functional Limits for tasks assessed                                        Exercises      General Comments        Pertinent Vitals/Pain Pain Assessment: 0-10 Pain Score: 3  Pain Location: back Pain Descriptors / Indicators: Aching;Sore Pain Intervention(s): Limited activity within patient's tolerance;Monitored during session;Premedicated before session    Home Living                      Prior Function            PT Goals (current goals can now be found in the care plan section) Acute Rehab PT Goals Patient Stated Goal: Regain IND PT Goal Formulation: With patient Time For Goal Achievement: 09/05/17 Potential to Achieve Goals: Good Progress towards PT goals: Progressing toward goals    Frequency    7X/week      PT Plan Current plan remains appropriate    Co-evaluation              AM-PAC PT "6 Clicks" Daily Activity  Outcome Measure  Difficulty turning over in  bed (including adjusting bedclothes, sheets and blankets)?: A Little Difficulty moving from lying on back to sitting on the side of the bed? : A Little Difficulty sitting down on and standing up from a chair with arms (e.g., wheelchair, bedside commode, etc,.)?: A Little Help needed moving to and from a bed to chair (including a wheelchair)?: A Little Help needed walking in hospital room?: A Little Help needed climbing 3-5 steps with a railing? : A Little 6 Click Score: 18    End of Session   Activity Tolerance: Patient tolerated treatment well Patient left: in chair;with call  bell/phone within reach;with family/visitor present Nurse Communication: Mobility status PT Visit Diagnosis: Difficulty in walking, not elsewhere classified (R26.2)     Time: [id number] PT Time Calculation (min) (ACUTE ONLY): 22 min  Charges:  $Gait Training: 8-22 mins                    G Codes:       Pg [phone]    Anetha Slagel 09/03/2017, 8:18 AM

## 2017-09-03 NOTE — Discharge Summary (Signed)
Physician Discharge Summary   Patient ID: Megan [name] MRN: [id number] DOB/AGE: Oct 24, 1927 84 y.o.  Admit date: 09/02/2017 Discharge date: 09/03/2017  Primary Diagnosis:   lumbar spinal stenosis  Admission Diagnoses:  Past Medical History:  Diagnosis Date  . Arthritis   . Complication of anesthesia   . PONV (postoperative nausea and vomiting)    Discharge Diagnoses:   Active Problems:   Spinal stenosis, lumbar region with neurogenic claudication  Procedure:  Procedure(s) (LRB): Central decompression lumbar laminectomy L5-S1 (N/A)   Consults: None  HPI: The patient is an 84 year old female who presented the with chief complaint of low back pain. She then developed right leg pain as well. She denied an injury. She did not improve with conservative measures. Imaging showed severe spinal stenosis at L5-S1.      Laboratory Data: Hospital Outpatient Visit on 08/28/2017  Component Date Value Ref Range Status  . aPTT 08/28/2017 28  24 - 36 seconds Final   Performed at [facility], [place] [address] [place], [place] [address]  . WBC 08/28/2017 13.9* 4.0 - 10.5 K/uL Final  . RBC 08/28/2017 4.66  3.87 - 5.11 MIL/uL Final  . Hemoglobin 08/28/2017 14.6  12.0 - 15.0 g/dL Final  . HCT 08/28/2017 42.8  36.0 - 46.0 % Final  . MCV 08/28/2017 91.8  78.0 - 100.0 fL Final  . MCH 08/28/2017 31.3  26.0 - 34.0 pg Final  . MCHC 08/28/2017 34.1  30.0 - 36.0 g/dL Final  . RDW 08/28/2017 13.7  11.5 - 15.5 % Final  . Platelets 08/28/2017 195  150 - 400 K/uL Final  . Neutrophils Relative % 08/28/2017 71  % Final  . Neutro Abs 08/28/2017 10.0* 1.7 - 7.7 K/uL Final  . Lymphocytes Relative 08/28/2017 16  % Final  . Lymphs Abs 08/28/2017 2.2  0.7 - 4.0 K/uL Final  . Monocytes Relative 08/28/2017 11  % Final  . Monocytes Absolute 08/28/2017 1.5* 0.1 - 1.0 K/uL Final  . Eosinophils Relative 08/28/2017 2  % Final  . Eosinophils Absolute 08/28/2017 0.2  0.0 - 0.7 K/uL Final  .  Basophils Relative 08/28/2017 0  % Final  . Basophils Absolute 08/28/2017 0.0  0.0 - 0.1 K/uL Final   Performed at [facility], [place] [address] [place], [place] [address]  . Sodium 08/28/2017 143  135 - 145 mmol/L Final  . Potassium 08/28/2017 4.6  3.5 - 5.1 mmol/L Final  . Chloride 08/28/2017 107  101 - 111 mmol/L Final  . CO2 08/28/2017 29  22 - 32 mmol/L Final  . Glucose, Bld 08/28/2017 106* 65 - 99 mg/dL Final  . BUN 08/28/2017 12  6 - 20 mg/dL Final  . Creatinine, Ser 08/28/2017 0.68  0.44 - 1.00 mg/dL Final  . Calcium 08/28/2017 9.2  8.9 - 10.3 mg/dL Final  . Total Protein 08/28/2017 6.5  6.5 - 8.1 g/dL Final  . Albumin 08/28/2017 3.7  3.5 - 5.0 g/dL Final  . AST 08/28/2017 16  15 - 41 U/L Final  . ALT 08/28/2017 13* 14 - 54 U/L Final  . Alkaline Phosphatase 08/28/2017 49  38 - 126 U/L Final  . Total Bilirubin 08/28/2017 0.9  0.3 - 1.2 mg/dL Final  . GFR calc non Af Amer 08/28/2017 >60  >60 mL/min Final  . GFR calc Af Amer 08/28/2017 >60  >60 mL/min Final   Comment: (NOTE) The eGFR has been calculated using the CKD EPI equation. This calculation has not  been validated in all clinical situations. eGFR's persistently <60 mL/min signify possible Chronic Kidney Disease.   Georgiann Hahn gap 08/28/2017 7  5 - 15 Final   Performed at [facility], [place] [address] [place], [place] [address]  . Prothrombin Time 08/28/2017 13.6  11.4 - 15.2 seconds Final  . INR 08/28/2017 1.05   Final   Performed at [facility], [place] [address] [place], [place] [address]  . ABO/RH(D) 08/28/2017 O POS   Final  . Antibody Screen 08/28/2017 NEG   Final  . Sample Expiration 08/28/2017 09/05/2017   Final  . Extend sample reason 08/28/2017    Final                   Value:NO TRANSFUSIONS OR PREGNANCY IN THE PAST 3 MONTHS Performed at [facility], [place] [address], [place], [place] [address]   . MRSA, PCR 08/28/2017 NEGATIVE  NEGATIVE  Final  . Staphylococcus aureus 08/28/2017 NEGATIVE  NEGATIVE Final   Comment: (NOTE) The Xpert SA Assay (FDA approved for NASAL specimens in patients 59 years of age and older), is one component of a comprehensive surveillance program. It is not intended to diagnose infection nor to guide or monitor treatment. Performed at [facility], [place] [address] [place], [place] [address]   . ABO/RH(D) 08/28/2017    Final                   Value:O POS Performed at [facility], [place] [address], [place], [place] [address]     X-Rays:Dg Lumbar Spine 2-3 Views  Result Date: 08/28/2017 CLINICAL DATA:  Low back pain. EXAM: LUMBAR SPINE - 2-3 VIEW COMPARISON:  CT 07/15/2017. FINDINGS: Lumbar spine numbered as per prior CT. Lumbar scoliosis concave left. Diffuse multilevel degenerative change. Stable mild anterolisthesis L5 on S1. No change from prior exam. No acute bony abnormality. Surgical clips right upper quadrant. Aortoiliac active visceral atherosclerotic vascular calcification. 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 IMPRESSION: 1. Lumbar spine scoliosis concave left with diffuse multilevel degenerative change. Stable mild anterolisthesis L5-S1. No change from prior exam. No acute bony abnormality identified. 2.  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 Assess for visceral aneurysms. EXAM: MRA ABDOMEN AND PELVIS WITH CONTRAST TECHNIQUE: Multiplanar, multiecho pulse sequences of the abdomen and pelvis were obtained with intravenous contrast. Angiographic images of abdomen and pelvis were obtained using MRA technique with intravenous contrast. CONTRAST:  32m MULTIHANCE GADOBENATE DIMEGLUMINE 529 MG/ML IV SOLN  COMPARISON:  07/15/2017, 08/28/2017 FINDINGS: ARTERIAL Aorta: Aorta is mildly ectatic and tortuous with minor atherosclerosis. No occlusive process, aneurysm, dissection or acute vascular process. Celiac axis: Patent origin without evidence of aneurysm. Celiac branches are also visualized proximally and patent. 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 Splenic artery remains patent and markedly tortuous. Superior mesenteric: Patent origin including its branches throughout the mesentery Left renal:           Widely patent Right renal:          Widely patent Inferior mesenteric:  Remains patent off the distal aorta Left iliac: Left common, internal  and external iliac arteries remain patent. No iliac occlusion or inflow disease. Right iliac: Right common, internal and external iliac arteries remain patent. No right inflow disease. VENOUS No veno-occlusive process. NONVASCULAR Lower chest: Large hiatal hernia noted. Normal heart size. No pericardial pleural effusion. Minor dependent basilar atelectasis. Hepatobiliary: No masses or other significant abnormality. Pancreas: No mass, inflammatory changes, or other significant abnormality. Spleen: Within normal limits in size and appearance. Adrenals/Urinary Tract: No masses identified. No evidence of hydronephrosis. Stomach/Bowel: Negative for bowel obstruction, significant dilatation, ileus, or free air. Large duodenal diverticulum noted in the left abdomen containing air which creates artifact. Lymphatic: No pathologically enlarged lymph nodes. Reproductive: Not imaged by this exam Other: None. Musculoskeletal: No suspicious bone lesions identified. 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 Negative for celiac aneurysm. Mesenteric and renal vasculature remain widely patent Aortoiliac mild tortuosity and ectasia without acute  vascular process Large hiatal hernia Large distal duodenum diverticulum with entrapped air. No acute intra-abdominal finding. Electronically Signed   By: Jerilynn Mages.  Shick M.D.   On: 09/01/2017 16:55   Dg Spine Portable 1 View  Result Date: 09/02/2017 CLINICAL DATA:  Lumbar disc disease. EXAM: PORTABLE SPINE - 1 VIEW image 3 COMPARISON:  Radiographs dated 08/28/2017 FINDINGS: Instruments are at the L5-S1 level. IMPRESSION: Instruments at L5-S1. Electronically Signed   By: Lorriane Shire M.D.   On: 09/02/2017 09:08   Dg Spine Portable 1 View  Result Date: 09/02/2017 CLINICAL DATA:  Intraoperative examination. EXAM: PORTABLE SPINE - 1 VIEW COMPARISON:  09/02/2017 FINDINGS: Moderate spondylosis of the lumbar spine include facet arthropathy. Disc space narrowing from the L2-3 level to the L5-S1 level. Mild anterolisthesis of L5 on S1 unchanged. Surgical instrument is present with tip between the spinous processes of L4 and L5. Recommend correlation with findings at the time of the procedure. IMPRESSION: Surgical instrument with tip between the spinous process of L4-L5. Moderate spondylosis with multilevel disc disease. Mild anterolisthesis of L5 on S1. Electronically Signed   By: Marin Olp M.D.   On: 09/02/2017 08:40   Dg Spine Portable 1 View  Result Date: 09/02/2017 CLINICAL DATA:  Lumbar surgery. EXAM: PORTABLE SPINE - 1 VIEW COMPARISON:  Lumbar spine radiographs 08/28/2017 and myelogram 07/15/2017 FINDINGS: A single cross-table lateral radiograph of the lumbar spine is provided. Comparison studies demonstrate 5 non rib-bearing lumbar type vertebrae with grade 1 anterolisthesis of L5 on S1. 2 needles are in place with their tips projecting over the posterior aspects of the L4 and L5 spinous processes. IMPRESSION: Intraoperative localization as above. Electronically Signed   By: Logan Bores M.D.   On: 09/02/2017 08:27      Hospital Course: Patient was admitted to [facility] and taken to the OR  and underwent the above state procedure without complications.  Patient tolerated the procedure well and was later transferred to the recovery room and then to the orthopaedic floor for postoperative care.  They were given PO and IV analgesics for pain control following their surgery.  They were given 24 hours of postoperative antibiotics.   PT was consulted postop to assist with mobility and transfers.  The patient was allowed to be WBAT with therapy and was taught back precautions. Discharge planning was consulted to help with postop disposition and equipment needs.  Patient had a good night on the evening of surgery and started to get up OOB with therapy on day one. Patient was seen in rounds  and was ready to go home on day one.  They were given discharge instructions and dressing directions.  They were instructed on when to follow up in the office with Dr. Gladstone Lighter.   Diet: Cardiac diet Activity:WBAT Follow-up:in 2 weeks Disposition - Home Discharged Condition: stable   Discharge Instructions    Call MD / Call 911   Complete by:  As directed    If you experience chest pain or shortness of breath, CALL 911 and be transported to the hospital emergency room.  If you develope a fever above 101 F, pus (white drainage) or increased drainage or redness at the wound, or calf pain, call your surgeon's office.   Constipation Prevention   Complete by:  As directed    Drink plenty of fluids.  Prune juice may be helpful.  You may use a stool softener, such as Colace (over the counter) 100 mg twice a day.  Use MiraLax (over the counter) for constipation as needed.   Diet - low sodium heart healthy   Complete by:  As directed    Discharge instructions   Complete by:  As directed    For the first three days, remove your dressing, and tape a piece of saran wrap over your incision Take your shower, then remove the saran wrap and put a clean dressing on, then reapply your sling. After three days you can  shower without the saran wrap.  No lifting or excessive bending No driving while taking pain medications. Call Dr. Gladstone Lighter if any wound complications or temperature of 101 degrees F or over.  Call the office for an appointment to see Dr. Gladstone Lighter in two weeks: [phone] and ask for Dr. Charlestine Night nurse, Brunilda Payor.   Increase activity slowly as tolerated   Complete by:  As directed      Allergies as of 09/03/2017   No Known Allergies     Medication List    STOP taking these medications   acetaminophen 500 MG tablet Commonly known as:  TYLENOL     TAKE these medications   aspirin EC 81 MG tablet Take 81 mg by mouth daily with lunch.   CALCIUM + D + K PO Take 1 tablet by mouth daily with breakfast.   HYDROcodone-acetaminophen 5-325 MG tablet Commonly known as:  NORCO/VICODIN Take 1 tablet by mouth every 4 (four) hours as needed for moderate pain ((score 4 to 6)).   methocarbamol 500 MG tablet Commonly known as:  ROBAXIN Take 1 tablet (500 mg total) by mouth every 6 (six) hours as needed for muscle spasms.   SYSTANE OP Place 1 drop into both eyes daily.   vitamin B-12 1000 MCG tablet Commonly known as:  CYANOCOBALAMIN Take 1,000 mcg by mouth daily with lunch.      Follow-up Information    Latanya Maudlin, MD. Schedule an appointment as soon as possible for a visit in 2 week(s).   Specialty:  Orthopedic Surgery Contact information: [address] [place] [address] [phone]           Signed: Ardeen Jourdain, PA-C Orthopaedic Surgery 09/03/2017, 7:39 AM

## 2017-09-03 NOTE — Evaluation (Signed)
Occupational Therapy Evaluation Patient Details Name: Megan RuthsSelma B Schrupp MRN: [id number] DOB: 1928/01/11 Today's Date: 09/03/2017    History of Present Illness Pt s/p L5-S1 central decompression   Clinical Impression   OT education complete/  Family will A as needed    Follow Up Recommendations  No OT follow up    Equipment Recommendations  None recommended by OT    Recommendations for Other Services       Precautions / Restrictions Precautions Precautions: Back Precaution Booklet Issued: Yes (comment) Precaution Comments: pt recalls 2/3 back precautions without cues - reviewed all Restrictions Weight Bearing Restrictions: No      Mobility Bed Mobility               General bed mobility comments: pt in chair  Transfers Overall transfer level: Needs assistance Equipment used: Rolling walker (2 wheeled) Transfers: Sit to/from [employer]GI CorporationStand;Stand Pivot Transfers Sit to Stand: Supervision Stand pivot transfers: Supervision       General transfer comment: cues for transition position, adherence to back precautions and use of UEs to self assist        ADL either performed or assessed with clinical judgement   ADL Overall ADL's : Needs assistance/impaired Eating/Feeding: Set up;Sitting   Grooming: Set up;Sitting   Upper Body Bathing: Set up;Sitting   Lower Body Bathing: Minimal assistance;Sit to/from stand;Cueing for sequencing;Cueing for back precautions   Upper Body Dressing : Set up;Sitting   Lower Body Dressing: Minimal assistance;Sit to/from stand;Cueing for sequencing;Cueing for safety;Adhering to back precautions   Toilet Transfer: Min guard;RW;Comfort height toilet   Toileting- Clothing Manipulation and Hygiene: Min guard;Sit to/from stand;Cueing for back precautions   Tub/ Shower Transfer: Tub transfer;Minimal assistance;Ambulation     General ADL Comments: education complete regarding ADL activity and back precautions     Vision Patient Visual  Report: No change from baseline       Perception     Praxis      Pertinent Vitals/Pain Pain Score: 1  Pain Location: back Pain Descriptors / Indicators: Tender Pain Intervention(s): Limited activity within patient's tolerance     Hand Dominance     Extremity/Trunk Assessment Upper Extremity Assessment Upper Extremity Assessment: Overall WFL for tasks assessed           Communication Communication Communication: No difficulties   Cognition Arousal/Alertness: Awake/alert Behavior During Therapy: WFL for tasks assessed/performed Overall Cognitive Status: Within Functional Limits for tasks assessed                                                Home Living Family/patient expects to be discharged to:: Private residence Living Arrangements: Alone Available Help at Discharge: Family Type of Home: House Home Access: Ramped entrance     Home Layout: One level     Bathroom Shower/Tub: Tub/shower unit         Home Equipment: Bedside commode;Walker - 2 wheels          Prior Functioning/Environment Level of Independence: Independent                          OT Goals(Current goals can be found in the care plan section) Acute Rehab OT Goals Patient Stated Goal: Regain IND OT Goal Formulation: With patient Time For Goal Achievement: 09/03/17  OT Frequency:     Barriers to D/C:  AM-PAC PT "6 Clicks" Daily Activity     Outcome Measure Help from another person eating meals?: None Help from another person taking care of personal grooming?: None Help from another person toileting, which includes using toliet, bedpan, or urinal?: A Little Help from another person bathing (including washing, rinsing, drying)?: A Little Help from another person to put on and taking off regular upper body clothing?: None Help from another person to put on and taking off regular lower body clothing?: A Little 6 Click Score: 21   End of  Session Nurse Communication: Mobility status  Activity Tolerance: Patient tolerated treatment well Patient left: in chair;with call bell/phone within reach;with family/visitor present                   Time: 1610-9604 OT Time Calculation (min): 24 min Charges:  OT General Charges $OT Visit: 1 Visit OT Evaluation $OT Eval Moderate Complexity: 1 Mod OT Treatments $Self Care/Home Management : 8-22 mins G-Codes:     Lise Auer, OT [phone]  Einar Crow D 09/03/2017, 12:06 PM

## 2018-01-12 ENCOUNTER — Other Ambulatory Visit: Payer: Self-pay | Admitting: Family Medicine

## 2018-01-12 DIAGNOSIS — Z1231 Encounter for screening mammogram for malignant neoplasm of breast: Secondary | ICD-10-CM

## 2018-03-15 ENCOUNTER — Ambulatory Visit
Admission: RE | Admit: 2018-03-15 | Discharge: 2018-03-15 | Disposition: A | Discharge status: Home / Self Care | Payer: Medicare Other | Source: Ambulatory Visit | Attending: Family Medicine | Admitting: Family Medicine

## 2018-03-15 DIAGNOSIS — Z1231 Encounter for screening mammogram for malignant neoplasm of breast: Secondary | ICD-10-CM

## 2019-06-06 IMAGING — DX DG LUMBAR SPINE 2-3V
3 series · 3 of 3 positions shown · non-contrast
Comparison: CT 07/15/2017.

CLINICAL DATA: Low back pain.

EXAM:
LUMBAR SPINE - 2-3 VIEW

[l-spine ap]
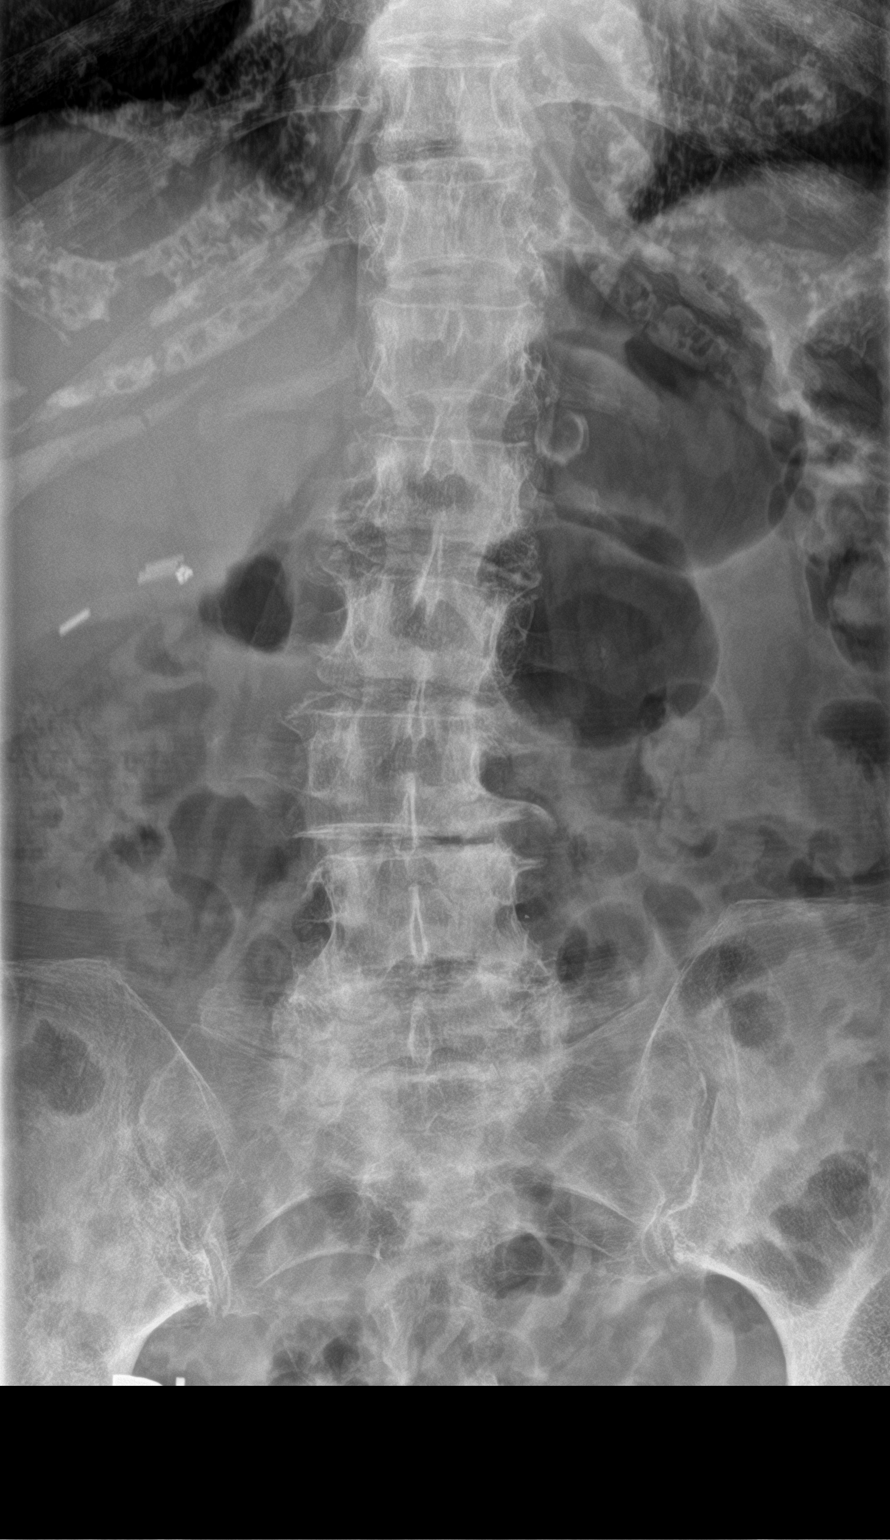

[l-spine lat]
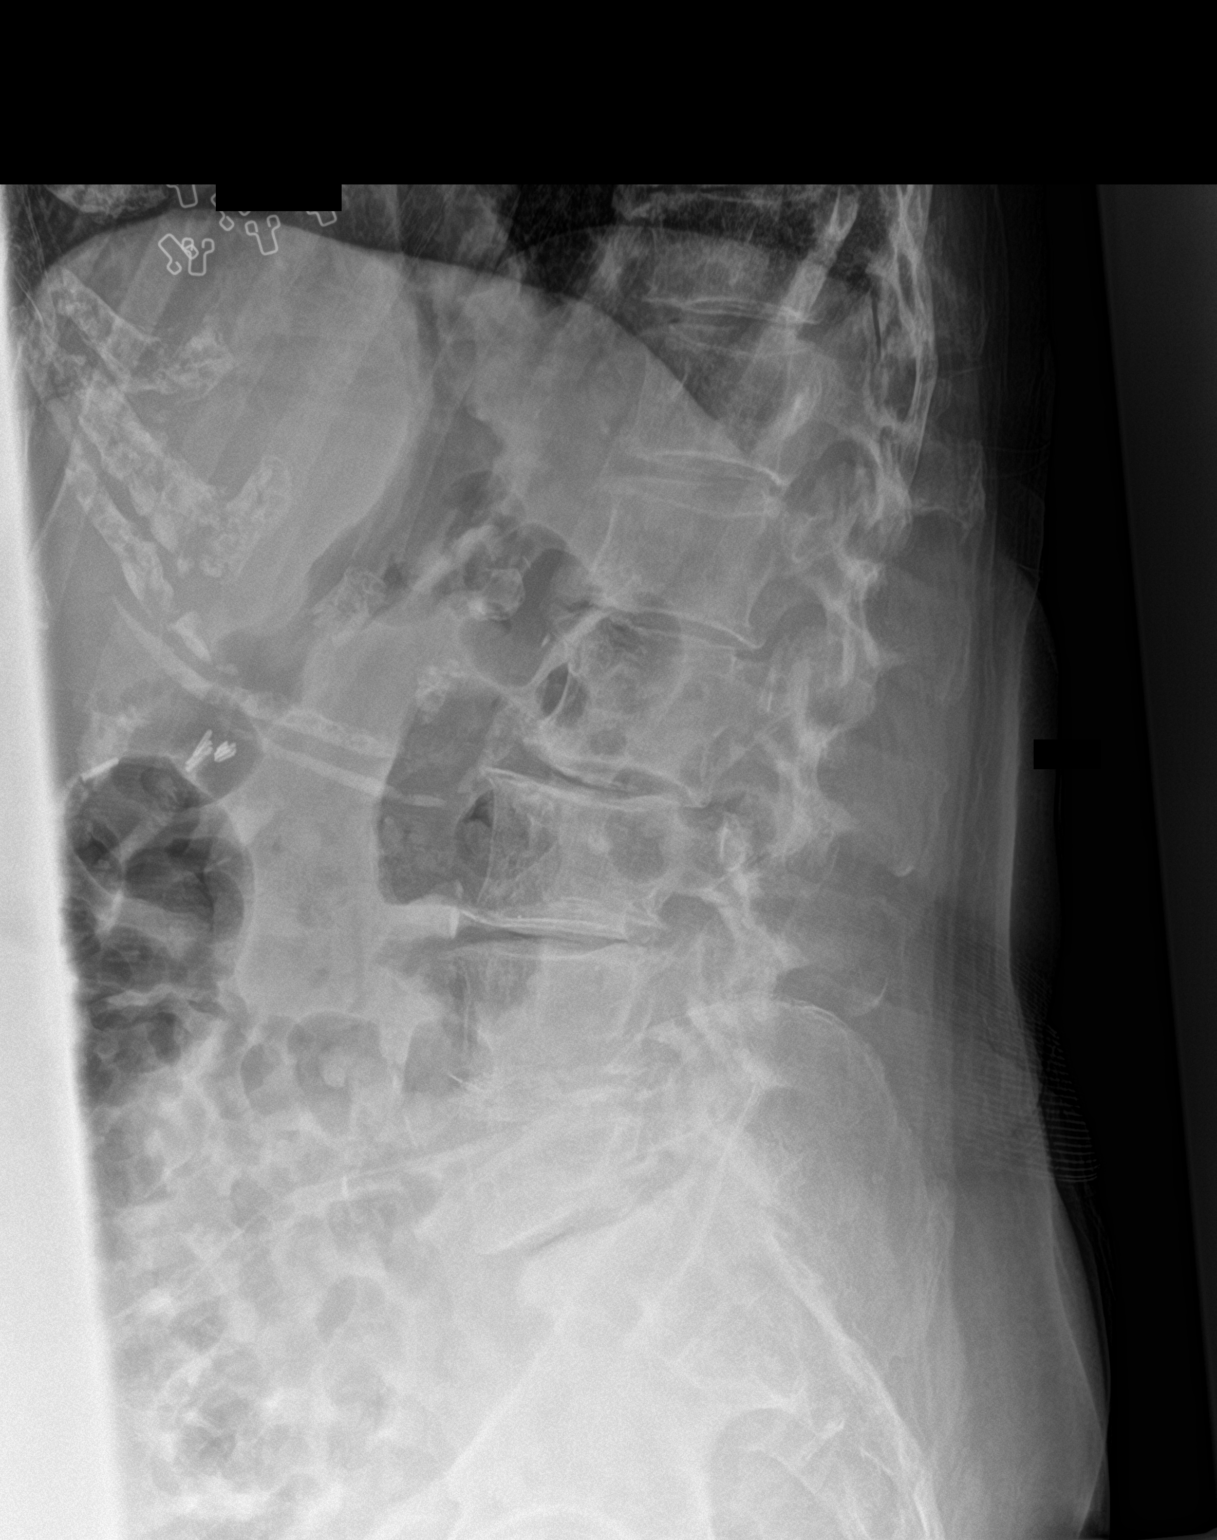

[l-spine spot]
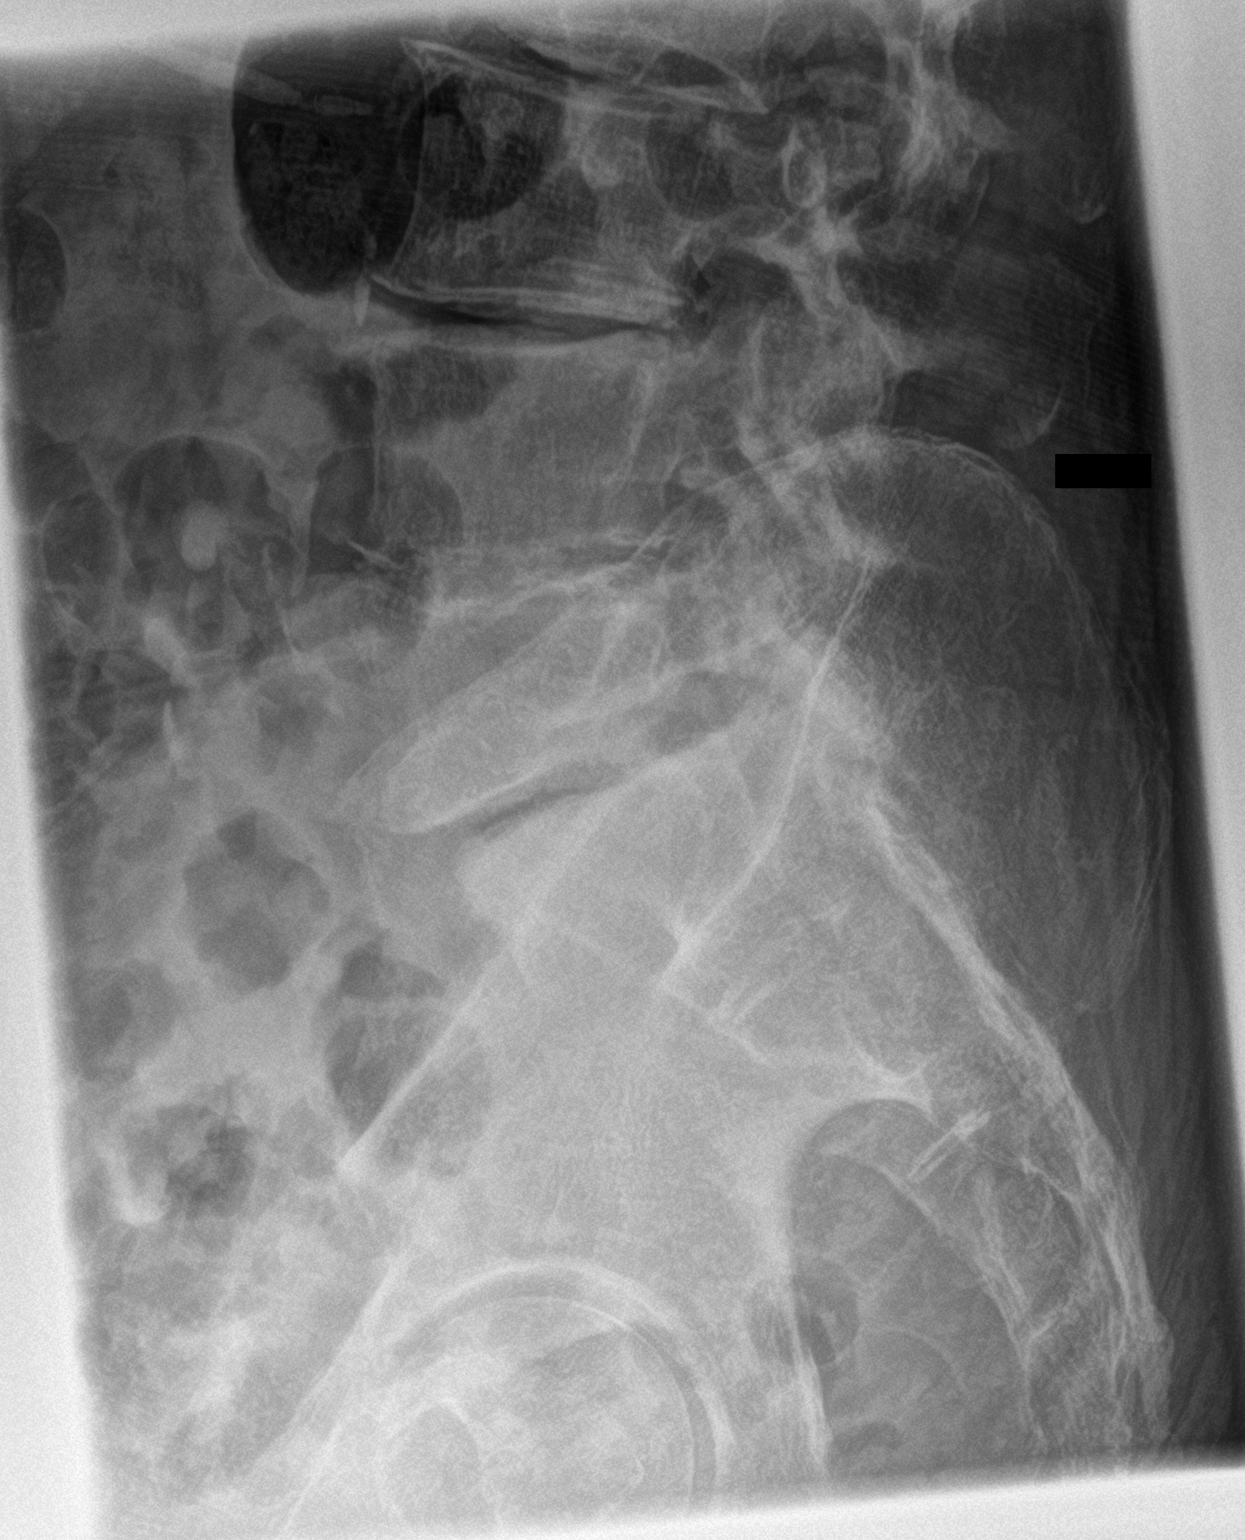

[3 of 3 positions shown; findings below may reference images not displayed]

FINDINGS: Lumbar spine numbered as per prior CT. Lumbar scoliosis concave
left. Diffuse multilevel degenerative change. Stable mild
anterolisthesis L5 on S1. No change from prior exam. No acute bony
abnormality. Surgical clips right upper quadrant. Aortoiliac active
visceral atherosclerotic vascular calcification. Small aneurysm of
what is most likely the celiac artery is noted.
IMPRESSION: 1. Lumbar spine scoliosis concave left with diffuse multilevel
degenerative change. Stable mild anterolisthesis L5-S1. No change
from prior exam. No acute bony abnormality identified.

2.  Small aneurysm what is most likely the celiac artery is noted.

## 2019-06-11 IMAGING — DX DG SPINE 1V PORT
1 series · 1 of 1 positions shown · non-contrast
Comparison: Radiographs dated 08/28/2017

CLINICAL DATA: Lumbar disc disease.

EXAM:
PORTABLE SPINE - 1 VIEW image 3

[l-spine lat]
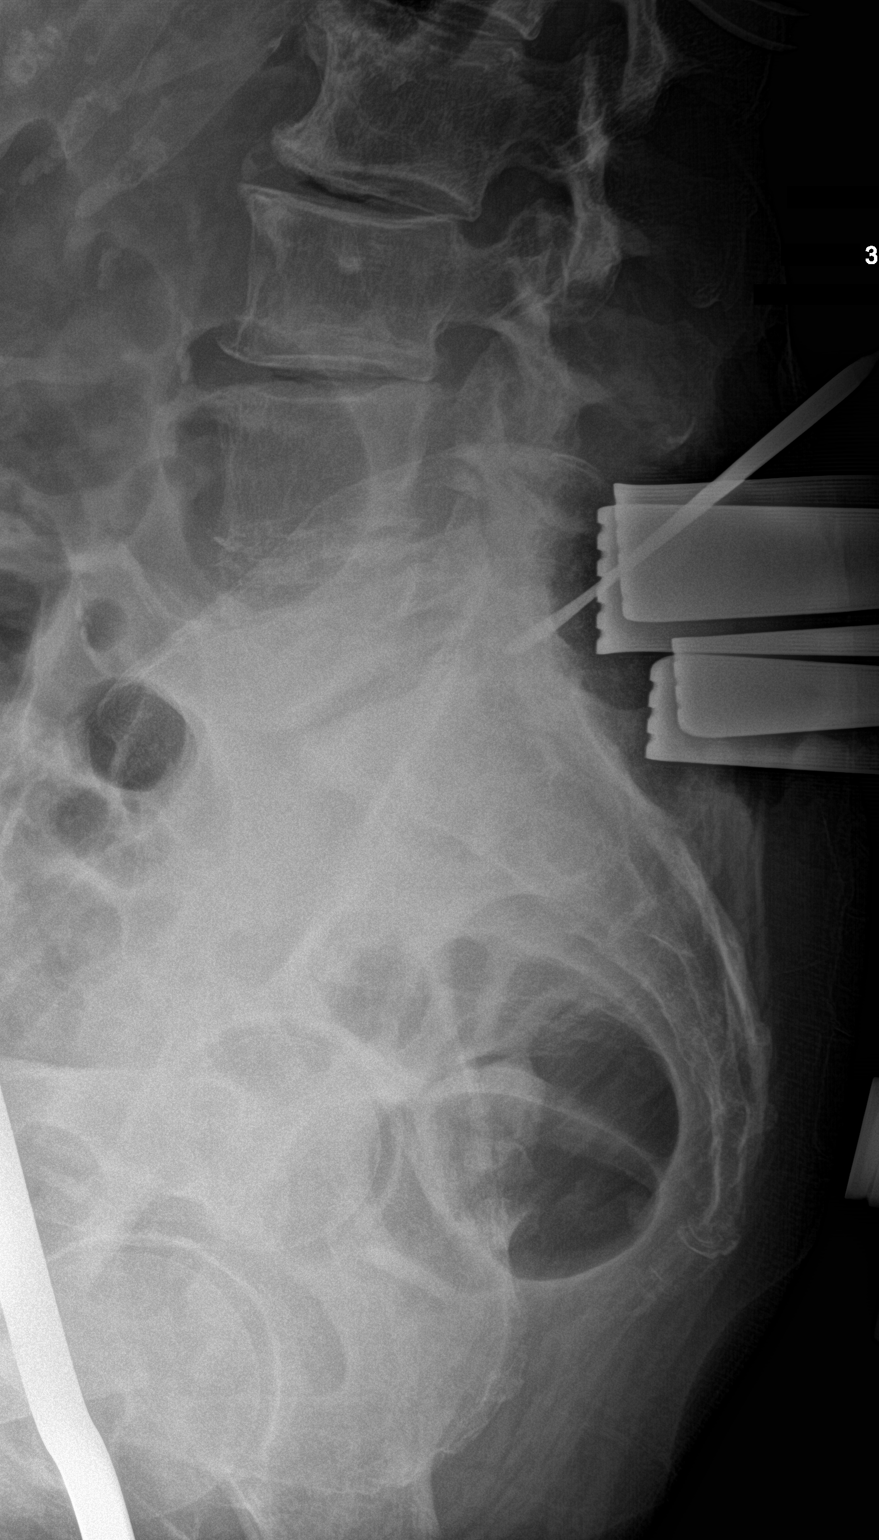

[1 of 1 positions shown; findings below may reference images not displayed]

FINDINGS: Instruments are at the L5-S1 level.
IMPRESSION: Instruments at L5-S1.

## 2019-06-11 IMAGING — DX DG SPINE 1V PORT
1 series · 1 of 1 positions shown · non-contrast
Comparison: Lumbar spine radiographs 08/28/2017 and myelogram
07/15/2017

CLINICAL DATA: Lumbar surgery.

EXAM:
PORTABLE SPINE - 1 VIEW

[l-spine lat]
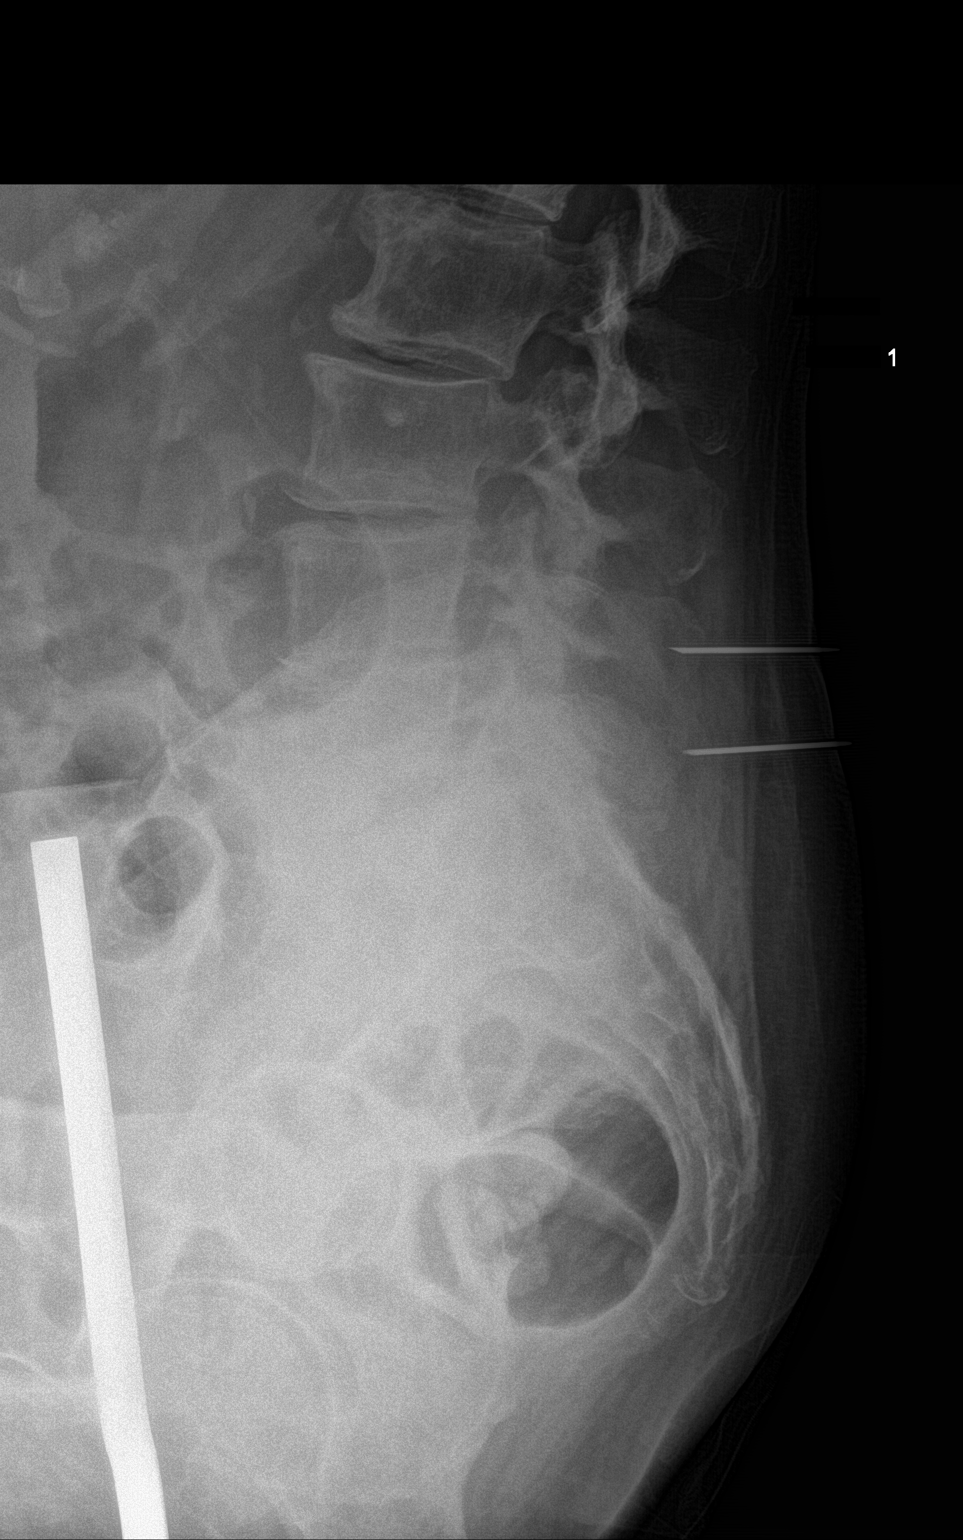

[1 of 1 positions shown; findings below may reference images not displayed]

FINDINGS: A single cross-table lateral radiograph of the lumbar spine is
provided. Comparison studies demonstrate 5 non rib-bearing lumbar
type vertebrae with grade 1 anterolisthesis of L5 on S1. 2 needles
are in place with their tips projecting over the posterior aspects
of the L4 and L5 spinous processes.
IMPRESSION: Intraoperative localization as above.

## 2019-06-11 IMAGING — DX DG SPINE 1V PORT
1 series · 1 of 1 positions shown · non-contrast
Comparison: 09/02/2017

CLINICAL DATA: Intraoperative examination.

EXAM:
PORTABLE SPINE - 1 VIEW

[l-spine lat]
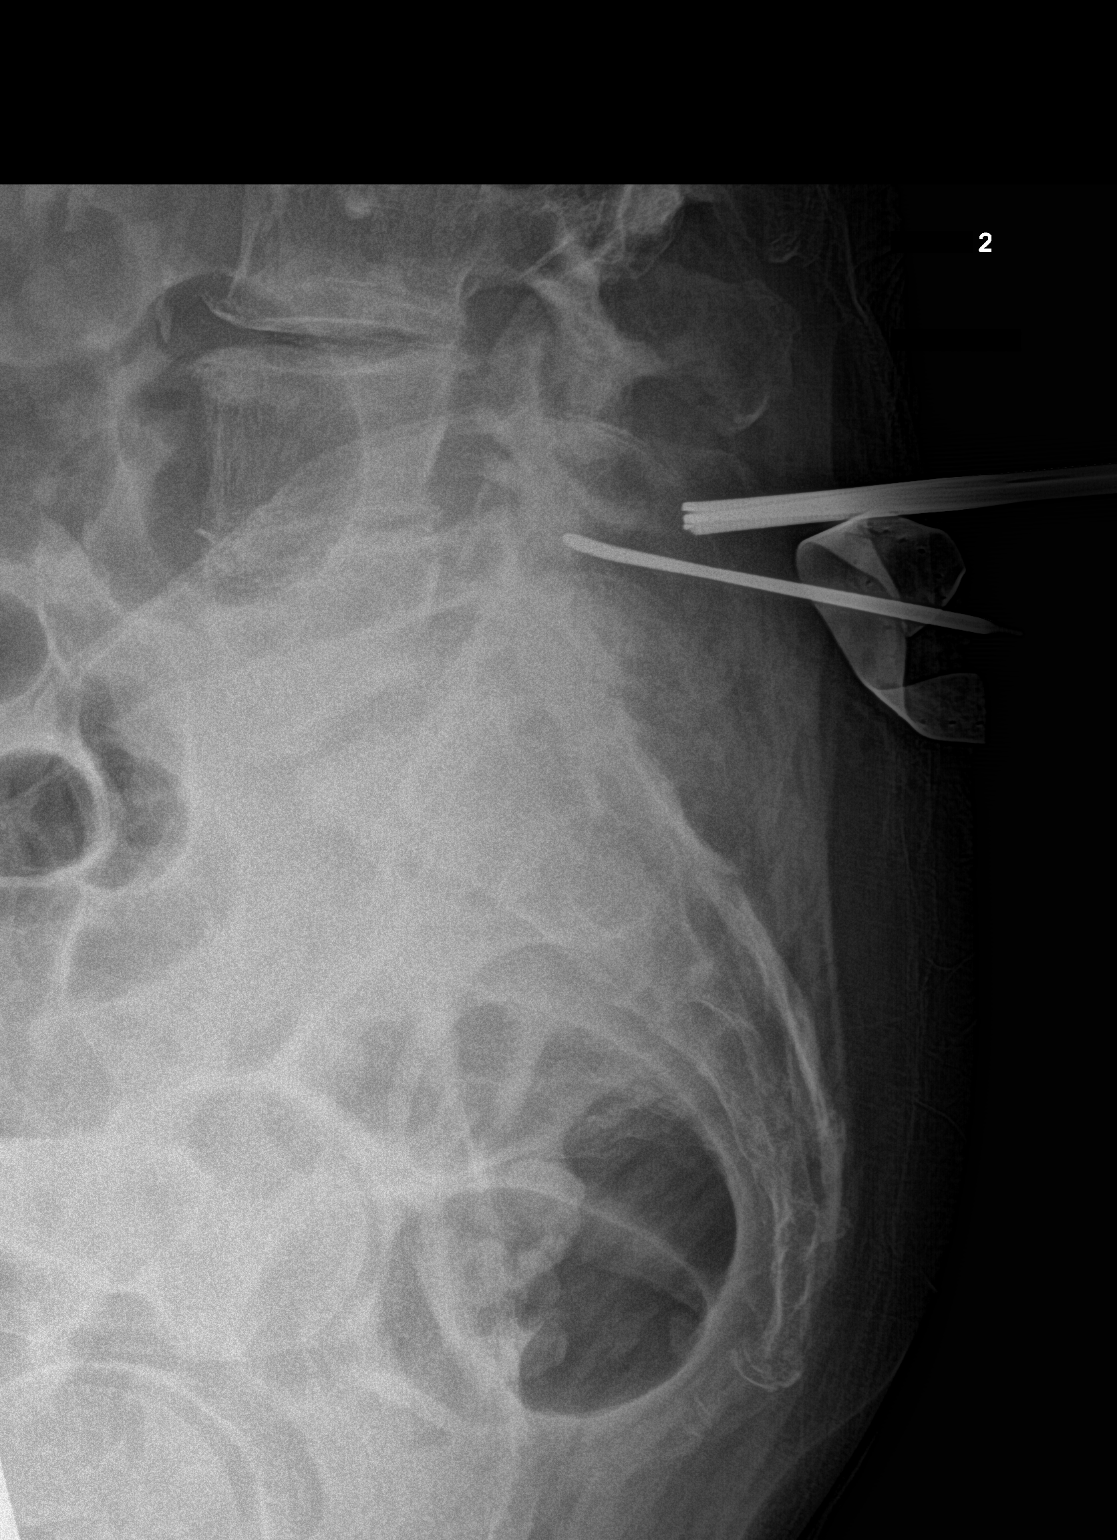

[1 of 1 positions shown; findings below may reference images not displayed]

FINDINGS: Moderate spondylosis of the lumbar spine include facet arthropathy.
Disc space narrowing from the L2-3 level to the L5-S1 level. Mild
anterolisthesis of L5 on S1 unchanged. Surgical instrument is
present with tip between the spinous processes of L4 and L5.
Recommend correlation with findings at the time of the procedure.
IMPRESSION: Surgical instrument with tip between the spinous process of L4-L5.

Moderate spondylosis with multilevel disc disease. Mild
anterolisthesis of L5 on S1.

## 2020-06-11 DIAGNOSIS — Z03818 Encounter for observation for suspected exposure to other biological agents ruled out: Secondary | ICD-10-CM | POA: Diagnosis not present

## 2020-06-11 DIAGNOSIS — R197 Diarrhea, unspecified: Secondary | ICD-10-CM | POA: Diagnosis not present

## 2020-09-09 ENCOUNTER — Emergency Department (HOSPITAL_COMMUNITY): Payer: Medicare PPO

## 2020-09-09 ENCOUNTER — Other Ambulatory Visit: Payer: Self-pay

## 2020-09-09 ENCOUNTER — Emergency Department (HOSPITAL_COMMUNITY)
Admission: EM | Admit: 2020-09-09 | Discharge: 2020-09-09 | Disposition: A | Discharge status: Home / Self Care | Payer: Medicare PPO | Attending: Emergency Medicine | Admitting: Emergency Medicine

## 2020-09-09 DIAGNOSIS — S52571B Other intraarticular fracture of lower end of right radius, initial encounter for open fracture type I or II: Secondary | ICD-10-CM | POA: Diagnosis not present

## 2020-09-09 DIAGNOSIS — Y9301 Activity, walking, marching and hiking: Secondary | ICD-10-CM | POA: Diagnosis not present

## 2020-09-09 DIAGNOSIS — W108XXA Fall (on) (from) other stairs and steps, initial encounter: Secondary | ICD-10-CM | POA: Diagnosis not present

## 2020-09-09 DIAGNOSIS — Y92009 Unspecified place in unspecified non-institutional (private) residence as the place of occurrence of the external cause: Secondary | ICD-10-CM | POA: Insufficient documentation

## 2020-09-09 DIAGNOSIS — S6991XA Unspecified injury of right wrist, hand and finger(s), initial encounter: Secondary | ICD-10-CM | POA: Diagnosis present

## 2020-09-09 DIAGNOSIS — M7989 Other specified soft tissue disorders: Secondary | ICD-10-CM | POA: Diagnosis not present

## 2020-09-09 DIAGNOSIS — M25531 Pain in right wrist: Secondary | ICD-10-CM | POA: Diagnosis not present

## 2020-09-09 DIAGNOSIS — S52571A Other intraarticular fracture of lower end of right radius, initial encounter for closed fracture: Secondary | ICD-10-CM

## 2020-09-09 DIAGNOSIS — S52501A Unspecified fracture of the lower end of right radius, initial encounter for closed fracture: Secondary | ICD-10-CM | POA: Diagnosis not present

## 2020-09-09 DIAGNOSIS — R600 Localized edema: Secondary | ICD-10-CM | POA: Diagnosis not present

## 2020-09-09 NOTE — ED Triage Notes (Signed)
Pt had a fall today and now has swelling to the right hand and right elbow.   No LOC, pt did not hit her head, no blood thinners. Pt able to ambulate to baseline.

## 2020-09-09 NOTE — Progress Notes (Signed)
Orthopedic Tech Progress Note Patient Details:  Megan [name] 1927/10/30 [id number]  Ortho Devices Type of Ortho Device: Arm sling, Sugartong splint Ortho Device/Splint Location: rue Ortho Device/Splint Interventions: Ordered, Application, Adjustment   Post Interventions Patient Tolerated: Well Instructions Provided: Care of device, Adjustment of device  Trinna Post 09/09/2020, 11:25 PM

## 2020-09-09 NOTE — Discharge Instructions (Addendum)
Fracture care There is evidence of a fracture on the x-ray. Pain:  Antiinflammatory medications: Take 600 mg of ibuprofen every 6 hours or 440 mg (over the counter dose) to 500 mg (prescription dose) of naproxen every 12 hours for the next 3 days. After this time, these medications may be used as needed for pain. Take these medications with food to avoid upset stomach. Choose only one of these medications, do not take them together. Acetaminophen (generic for Tylenol): Should you continue to have additional pain while taking the ibuprofen or naproxen, you may add in acetaminophen as needed. Your daily total maximum amount of acetaminophen from all sources should be limited to 4000mg /day for persons without liver problems, or 2000mg /day for those with liver problems. Ice: May apply ice to the injured area for no more than 15 minutes at a time to reduce swelling and pain. Elevation: Keep the extremity elevated whenever possible to reduce swelling and pain. Splint: Keep the splint clean and dry.  Protect it from water during bathing.  If the splint gets wet, you will need to have it reapplied.  Do not leave a wet splint against the skin as this can cause skin breakdown.  Call the orthopedist office or come to the ED for splint replacement, if needed.  Follow-up: Follow-up with the orthopedic specialist for any further management of this issue.  Call the number provided on Tuesday, July 5 to set up an appointment. Return: Return to the emergency department for severely increased pain, numbness, blanching of the skin, or any other major concerns.

## 2020-09-09 NOTE — ED Provider Notes (Signed)
[facility]-EMERGENCY DEPT Provider Note   CSN: [id number] Arrival date & time: 09/09/20  1916     History No chief complaint on file.   Megan [name] is a 87 y.o. female.  The history is provided by the patient and a relative (Son).      Megan [name] is a 87 y.o. female, with a history of arthritis, presenting to the ED accompanied by her son with injury to the right wrist that occurred around 5 PM this evening.  She says she lost her balance while walking up the steps to her porch, fell forward, landing on outstretched right arm. She endorses throbbing pain to the right wrist along with swelling and bruising. She also notes an abrasion to her right elbow. Denies anticoagulation. Denies head injury, LOC, dizziness, chest pain, shortness of breath, abdominal pain, other extremity pain/injury, numbness, weakness, neck/back pain, or any other complaints.  Past Medical History:  Diagnosis Date   Arthritis    Complication of anesthesia    PONV (postoperative nausea and vomiting)     Patient Active Problem List   Diagnosis Date Noted   Spinal stenosis, lumbar region with neurogenic claudication 09/02/2017    Past Surgical History:  Procedure Laterality Date   ABDOMINAL HYSTERECTOMY     APPENDECTOMY     CHOLECYSTECTOMY     KNEE ARTHROSCOPY Right    LUMBAR LAMINECTOMY/DECOMPRESSION MICRODISCECTOMY N/A 09/02/2017   Procedure: Central decompression lumbar laminectomy L5-S1;  Surgeon: Ranee Gosselin, MD;  Location: [facility];  Service: Orthopedics;  Laterality: N/A;   SHOULDER ACROMIOPLASTY     Spur     OB History   No obstetric history on file.     Family History  Problem Relation Age of Onset   Breast cancer Neg Hx     Social History   Tobacco Use   Smoking status: Never   Smokeless tobacco: Never  Vaping Use   Vaping Use: Never used  Substance Use Topics   Alcohol use: Never   Drug use: Not Currently    Home Medications Prior to  Admission medications   Medication Sig Start Date End Date Taking? Authorizing Provider  aspirin EC 81 MG tablet Take 81 mg by mouth daily with lunch.     [provider]  Calcium-Vitamin D-Vitamin K (CALCIUM + D + K PO) Take 1 tablet by mouth daily with breakfast.    [provider]  HYDROcodone-acetaminophen (NORCO/VICODIN) 5-325 MG tablet Take 1 tablet by mouth every 4 (four) hours as needed for moderate pain ((score 4 to 6)). 09/03/17   Porterfield, Amber, PA-C  methocarbamol (ROBAXIN) 500 MG tablet Take 1 tablet (500 mg total) by mouth every 6 (six) hours as needed for muscle spasms. 09/03/17   Porterfield, Hospital doctor, PA-C  Polyethyl Glycol-Propyl Glycol (SYSTANE OP) Place 1 drop into both eyes daily.    [provider]  vitamin B-12 (CYANOCOBALAMIN) 1000 MCG tablet Take 1,000 mcg by mouth daily with lunch.     [provider]    Allergies    Patient has no known allergies.  Review of Systems   Review of Systems  Constitutional:  Negative for diaphoresis.  Respiratory:  Negative for shortness of breath.   Cardiovascular:  Negative for chest pain.  Gastrointestinal:  Negative for nausea and vomiting.  Musculoskeletal:  Positive for arthralgias and joint swelling. Negative for back pain and neck pain.  Neurological:  Negative for weakness and numbness.   Physical Exam Updated Vital Signs BP Marland Kitchen)  173/86   Pulse 85   Temp 97.8 F (36.6 C) (Oral)   Resp 16   Wt 81.6 kg   SpO2 95%   BMI 26.58 kg/m   Physical Exam Vitals and nursing note reviewed.  Constitutional:      General: She is not in acute distress.    Appearance: Normal appearance. She is well-developed. She is not diaphoretic.  HENT:     Head: Normocephalic and atraumatic.  Eyes:     Conjunctiva/sclera: Conjunctivae normal.  Cardiovascular:     Rate and Rhythm: Normal rate and regular rhythm.     Pulses: Normal pulses.          Radial pulses are 2+ on the right side and 2+ on the left  side.  Pulmonary:     Effort: Pulmonary effort is normal.  Musculoskeletal:     Cervical back: Neck supple.     Comments: Tenderness, swelling, bruising to the right wrist, centered along the distal radius.  No definite deformity. She does have some pain and bruising to the right dorsal hand as well. Small, superficial abrasion to the right elbow. Full range of motion in the fingers of the right hand, the right elbow, and right shoulder without noted difficulty. The patient's other extremities were examined, palpated, and the joints were ranged without evidence of tenderness, swelling, deformity, instability, or pain with range of motion of the joints. No midline spinal tenderness.  Skin:    General: Skin is warm and dry.     Capillary Refill: Capillary refill takes less than 2 seconds.     Coloration: Skin is not pale.  Neurological:     Mental Status: She is alert.     Comments: No noted acute cognitive deficit. Sensation grossly intact to light touch in the extremities.   Grip strengths equal bilaterally.   Strength 5/5 in all extremities.  No gait disturbance.  Coordination intact.  Cranial nerves III-XII grossly intact.  Handles oral secretions without noted difficulty.  No noted phonation or speech deficit. No facial droop.   Psychiatric:        Behavior: Behavior normal.    ED Results / Procedures / Treatments   Labs (all labs ordered are listed, but only abnormal results are displayed) Labs Reviewed - No data to display  EKG None  Radiology DG Elbow Complete Right  Result Date: 09/09/2020 CLINICAL DATA:  Fall today with swelling about the hand, wrist and elbow. EXAM: RIGHT ELBOW - COMPLETE 3+ VIEW COMPARISON:  None. FINDINGS: There is no evidence of fracture, dislocation, or joint effusion. There is no evidence of arthropathy or other focal bone abnormality. Mild soft tissue edema overlies the olecranon process. IMPRESSION: Mild soft tissue edema. No acute fracture or  dislocation. Electronically Signed   By: Narda Rutherford M.D.   On: 09/09/2020 20:43   DG Wrist Complete Right  Result Date: 09/09/2020 CLINICAL DATA:  Right hand/wrist injury. Fall today with swelling about the hand, wrist and elbow. EXAM: RIGHT WRIST - COMPLETE 3+ VIEW COMPARISON:  None. FINDINGS: Mildly comminuted and displaced distal radius fracture involves the dorsal aspect of the distal radius (ulnar aspect) and extends into the radiocarpal and distal radioulnar joints. There is mild background radiocarpal joint space narrowing. No visualized ulna styloid fracture. Bones are diffusely under mineralized. No carpal bone fracture. Osteoarthritis at the base of the thumb. IMPRESSION: Mildly comminuted and displaced distal radius fracture extending into the radiocarpal and distal radioulnar joints. Electronically Signed   By: Shawna Orleans  Sanford M.D.   On: 09/09/2020 20:42   DG Hand Complete Right  Result Date: 09/09/2020 CLINICAL DATA:  Right hand injury. Fall today with swelling about the hand, wrist and elbow. EXAM: RIGHT HAND - COMPLETE 3+ VIEW COMPARISON:  None. FINDINGS: Distal radius fracture better assessed on concurrent wrist exam. There is no additional fracture of the hand. Focal soft tissue prominence of the dorsum of the metacarpals but no evidence of metacarpal fracture or dislocation. There is multifocal osteoarthritis, prominently involving the thumb at the carpal metacarpal joint, as well as the distal interphalangeal joints of the digits. IMPRESSION: 1. Distal radius fracture better assessed on concurrent wrist exam. No additional fracture of the hand. 2. Focal soft tissue prominence of the dorsum of the hand. 3. Multifocal osteoarthritis. Electronically Signed   By: Narda Rutherford M.D.   On: 09/09/2020 20:41    Procedures Procedures   Medications Ordered in ED Medications - No data to display  ED Course  I have reviewed the triage vital signs and the nursing notes.  Pertinent  labs & imaging results that were available during my care of the patient were reviewed by me and considered in my medical decision making (see chart for details).  Clinical Course as of 09/09/20 2326  Wynelle Link Sep 09, 2020  2148 Spoke with Dr. Aundria Rud, hand surgery. We discussed the patient's injury and x-ray findings.  Recommends placing the patient in a sugar-tong splint and following up in the office this week.  Likely nonsurgical. [SJ]    Clinical Course User Index [SJ] Ledarius Leeson C, PA-C   MDM Rules/Calculators/A&P                          Patient presents with injury to the right wrist.  No evidence of neurovascular compromise. I personally reviewed the patient's imaging studies. Distal radius fracture on wrist x-ray. Patient placed in a sugar-tong splint, given a sling, follow-up with orthopedics in the office. Circulation, motor function, sensation intact before and after splinting.  Findings and plan of care discussed with attending physician, Sherin Quarry, MD.   Final Clinical Impression(s) / ED Diagnoses Final diagnoses:  Other closed intra-articular fracture of distal end of right radius, initial encounter    Rx / DC Orders ED Discharge Orders     None        Concepcion Living 09/09/20 2331    Cheryll Cockayne, MD 09/14/20 [phone]

## 2020-09-12 DIAGNOSIS — M25531 Pain in right wrist: Secondary | ICD-10-CM | POA: Diagnosis not present

## 2020-09-12 DIAGNOSIS — S52514A Nondisplaced fracture of right radial styloid process, initial encounter for closed fracture: Secondary | ICD-10-CM | POA: Diagnosis not present

## 2020-10-04 DIAGNOSIS — S52514D Nondisplaced fracture of right radial styloid process, subsequent encounter for closed fracture with routine healing: Secondary | ICD-10-CM | POA: Diagnosis not present

## 2020-10-15 DIAGNOSIS — I10 Essential (primary) hypertension: Secondary | ICD-10-CM | POA: Diagnosis not present

## 2020-10-15 DIAGNOSIS — R32 Unspecified urinary incontinence: Secondary | ICD-10-CM | POA: Diagnosis not present

## 2020-10-15 DIAGNOSIS — S52514D Nondisplaced fracture of right radial styloid process, subsequent encounter for closed fracture with routine healing: Secondary | ICD-10-CM | POA: Diagnosis not present

## 2020-10-15 DIAGNOSIS — Z7982 Long term (current) use of aspirin: Secondary | ICD-10-CM | POA: Diagnosis not present

## 2020-10-15 DIAGNOSIS — Z9181 History of falling: Secondary | ICD-10-CM | POA: Diagnosis not present

## 2020-10-16 DIAGNOSIS — R030 Elevated blood-pressure reading, without diagnosis of hypertension: Secondary | ICD-10-CM | POA: Diagnosis not present

## 2020-10-16 DIAGNOSIS — R011 Cardiac murmur, unspecified: Secondary | ICD-10-CM | POA: Diagnosis not present

## 2020-10-17 DIAGNOSIS — Z9181 History of falling: Secondary | ICD-10-CM | POA: Diagnosis not present

## 2020-10-17 DIAGNOSIS — Z7982 Long term (current) use of aspirin: Secondary | ICD-10-CM | POA: Diagnosis not present

## 2020-10-17 DIAGNOSIS — R32 Unspecified urinary incontinence: Secondary | ICD-10-CM | POA: Diagnosis not present

## 2020-10-17 DIAGNOSIS — I10 Essential (primary) hypertension: Secondary | ICD-10-CM | POA: Diagnosis not present

## 2020-10-17 DIAGNOSIS — S52514D Nondisplaced fracture of right radial styloid process, subsequent encounter for closed fracture with routine healing: Secondary | ICD-10-CM | POA: Diagnosis not present

## 2020-10-22 DIAGNOSIS — S52514D Nondisplaced fracture of right radial styloid process, subsequent encounter for closed fracture with routine healing: Secondary | ICD-10-CM | POA: Diagnosis not present

## 2020-10-22 DIAGNOSIS — I10 Essential (primary) hypertension: Secondary | ICD-10-CM | POA: Diagnosis not present

## 2020-10-22 DIAGNOSIS — Z9181 History of falling: Secondary | ICD-10-CM | POA: Diagnosis not present

## 2020-10-22 DIAGNOSIS — Z7982 Long term (current) use of aspirin: Secondary | ICD-10-CM | POA: Diagnosis not present

## 2020-10-22 DIAGNOSIS — R32 Unspecified urinary incontinence: Secondary | ICD-10-CM | POA: Diagnosis not present

## 2020-10-24 DIAGNOSIS — S52514D Nondisplaced fracture of right radial styloid process, subsequent encounter for closed fracture with routine healing: Secondary | ICD-10-CM | POA: Diagnosis not present

## 2020-10-24 DIAGNOSIS — Z9181 History of falling: Secondary | ICD-10-CM | POA: Diagnosis not present

## 2020-10-24 DIAGNOSIS — Z7982 Long term (current) use of aspirin: Secondary | ICD-10-CM | POA: Diagnosis not present

## 2020-10-24 DIAGNOSIS — R32 Unspecified urinary incontinence: Secondary | ICD-10-CM | POA: Diagnosis not present

## 2020-10-24 DIAGNOSIS — I10 Essential (primary) hypertension: Secondary | ICD-10-CM | POA: Diagnosis not present

## 2020-10-29 DIAGNOSIS — S52514D Nondisplaced fracture of right radial styloid process, subsequent encounter for closed fracture with routine healing: Secondary | ICD-10-CM | POA: Diagnosis not present

## 2020-10-29 DIAGNOSIS — Z9181 History of falling: Secondary | ICD-10-CM | POA: Diagnosis not present

## 2020-10-29 DIAGNOSIS — I10 Essential (primary) hypertension: Secondary | ICD-10-CM | POA: Diagnosis not present

## 2020-10-29 DIAGNOSIS — Z7982 Long term (current) use of aspirin: Secondary | ICD-10-CM | POA: Diagnosis not present

## 2020-10-29 DIAGNOSIS — R32 Unspecified urinary incontinence: Secondary | ICD-10-CM | POA: Diagnosis not present

## 2020-11-01 DIAGNOSIS — S52514S Nondisplaced fracture of right radial styloid process, sequela: Secondary | ICD-10-CM | POA: Diagnosis not present

## 2020-11-20 DIAGNOSIS — R011 Cardiac murmur, unspecified: Secondary | ICD-10-CM | POA: Diagnosis not present

## 2020-11-20 DIAGNOSIS — R5383 Other fatigue: Secondary | ICD-10-CM | POA: Diagnosis not present

## 2020-11-20 DIAGNOSIS — R030 Elevated blood-pressure reading, without diagnosis of hypertension: Secondary | ICD-10-CM | POA: Diagnosis not present

## 2020-12-05 DIAGNOSIS — Z961 Presence of intraocular lens: Secondary | ICD-10-CM | POA: Diagnosis not present

## 2021-01-18 DIAGNOSIS — M170 Bilateral primary osteoarthritis of knee: Secondary | ICD-10-CM | POA: Diagnosis not present

## 2021-02-22 DIAGNOSIS — M170 Bilateral primary osteoarthritis of knee: Secondary | ICD-10-CM | POA: Diagnosis not present

## 2021-03-01 DIAGNOSIS — M170 Bilateral primary osteoarthritis of knee: Secondary | ICD-10-CM | POA: Diagnosis not present

## 2021-03-01 DIAGNOSIS — M25561 Pain in right knee: Secondary | ICD-10-CM | POA: Diagnosis not present

## 2021-03-05 DIAGNOSIS — R011 Cardiac murmur, unspecified: Secondary | ICD-10-CM | POA: Diagnosis not present

## 2021-03-05 DIAGNOSIS — Z0000 Encounter for general adult medical examination without abnormal findings: Secondary | ICD-10-CM | POA: Diagnosis not present

## 2021-03-05 DIAGNOSIS — R739 Hyperglycemia, unspecified: Secondary | ICD-10-CM | POA: Diagnosis not present

## 2021-03-06 DIAGNOSIS — M170 Bilateral primary osteoarthritis of knee: Secondary | ICD-10-CM | POA: Diagnosis not present

## 2021-04-18 DIAGNOSIS — M170 Bilateral primary osteoarthritis of knee: Secondary | ICD-10-CM | POA: Diagnosis not present

## 2021-10-16 DIAGNOSIS — Z809 Family history of malignant neoplasm, unspecified: Secondary | ICD-10-CM | POA: Diagnosis not present

## 2021-10-16 DIAGNOSIS — Z791 Long term (current) use of non-steroidal anti-inflammatories (NSAID): Secondary | ICD-10-CM | POA: Diagnosis not present

## 2021-10-16 DIAGNOSIS — R32 Unspecified urinary incontinence: Secondary | ICD-10-CM | POA: Diagnosis not present

## 2021-10-16 DIAGNOSIS — R030 Elevated blood-pressure reading, without diagnosis of hypertension: Secondary | ICD-10-CM | POA: Diagnosis not present

## 2021-10-16 DIAGNOSIS — G8929 Other chronic pain: Secondary | ICD-10-CM | POA: Diagnosis not present

## 2021-12-11 DIAGNOSIS — H02102 Unspecified ectropion of right lower eyelid: Secondary | ICD-10-CM | POA: Diagnosis not present

## 2021-12-11 DIAGNOSIS — H524 Presbyopia: Secondary | ICD-10-CM | POA: Diagnosis not present

## 2021-12-11 DIAGNOSIS — H52203 Unspecified astigmatism, bilateral: Secondary | ICD-10-CM | POA: Diagnosis not present

## 2021-12-11 DIAGNOSIS — Z961 Presence of intraocular lens: Secondary | ICD-10-CM | POA: Diagnosis not present

## 2022-03-06 ENCOUNTER — Other Ambulatory Visit: Payer: Self-pay

## 2022-03-06 ENCOUNTER — Emergency Department (HOSPITAL_COMMUNITY): Payer: Medicare PPO

## 2022-03-06 ENCOUNTER — Emergency Department (HOSPITAL_COMMUNITY)
Admission: EM | Admit: 2022-03-06 | Discharge: 2022-03-07 | Disposition: A | Discharge status: Home / Self Care | Payer: Medicare PPO | Attending: Emergency Medicine | Admitting: Emergency Medicine

## 2022-03-06 DIAGNOSIS — S0993XA Unspecified injury of face, initial encounter: Secondary | ICD-10-CM | POA: Diagnosis not present

## 2022-03-06 DIAGNOSIS — I10 Essential (primary) hypertension: Secondary | ICD-10-CM | POA: Diagnosis not present

## 2022-03-06 DIAGNOSIS — Z23 Encounter for immunization: Secondary | ICD-10-CM | POA: Diagnosis not present

## 2022-03-06 DIAGNOSIS — R9431 Abnormal electrocardiogram [ECG] [EKG]: Secondary | ICD-10-CM | POA: Diagnosis not present

## 2022-03-06 DIAGNOSIS — W010XXA Fall on same level from slipping, tripping and stumbling without subsequent striking against object, initial encounter: Secondary | ICD-10-CM | POA: Diagnosis not present

## 2022-03-06 DIAGNOSIS — S0083XA Contusion of other part of head, initial encounter: Secondary | ICD-10-CM | POA: Insufficient documentation

## 2022-03-06 DIAGNOSIS — S0101XA Laceration without foreign body of scalp, initial encounter: Secondary | ICD-10-CM | POA: Insufficient documentation

## 2022-03-06 DIAGNOSIS — T07XXXA Unspecified multiple injuries, initial encounter: Secondary | ICD-10-CM | POA: Diagnosis not present

## 2022-03-06 DIAGNOSIS — S199XXA Unspecified injury of neck, initial encounter: Secondary | ICD-10-CM | POA: Diagnosis not present

## 2022-03-06 DIAGNOSIS — W19XXXA Unspecified fall, initial encounter: Secondary | ICD-10-CM | POA: Diagnosis not present

## 2022-03-06 DIAGNOSIS — Z79899 Other long term (current) drug therapy: Secondary | ICD-10-CM | POA: Insufficient documentation

## 2022-03-06 DIAGNOSIS — S0990XA Unspecified injury of head, initial encounter: Secondary | ICD-10-CM | POA: Diagnosis not present

## 2022-03-06 DIAGNOSIS — Z7982 Long term (current) use of aspirin: Secondary | ICD-10-CM | POA: Insufficient documentation

## 2022-03-06 DIAGNOSIS — S098XXA Other specified injuries of head, initial encounter: Secondary | ICD-10-CM

## 2022-03-06 LAB — CBG MONITORING, ED: Glucose-Capillary: 166 mg/dL — ABNORMAL HIGH (ref 70–99)

## 2022-03-06 MED ORDER — ACETAMINOPHEN 325 MG PO TABS
650.0000 mg | ORAL_TABLET | Freq: Once | ORAL | Status: AC
  Administered 2022-03-06: 650 mg via ORAL
  Filled 2022-03-06: qty 2

## 2022-03-06 MED ORDER — TETANUS-DIPHTH-ACELL PERTUSSIS 5-2.5-18.5 LF-MCG/0.5 IM SUSY
0.5000 mL | PREFILLED_SYRINGE | Freq: Once | INTRAMUSCULAR | Status: AC
  Administered 2022-03-06: 0.5 mL via INTRAMUSCULAR
  Filled 2022-03-06: qty 0.5

## 2022-03-06 MED ORDER — LIDOCAINE-EPINEPHRINE (PF) 2 %-1:200000 IJ SOLN
10.0000 mL | Freq: Once | INTRAMUSCULAR | Status: DC
  Filled 2022-03-06: qty 20

## 2022-03-06 NOTE — Discharge Instructions (Signed)
Up with your primary care physician or any healthcare professional in the next 7 to 10 days for suture removal.   Keep the area clean.

## 2022-03-06 NOTE — ED Notes (Signed)
Patient transported to CT 

## 2022-03-06 NOTE — ED Provider Notes (Signed)
[facility] EMERGENCY DEPARTMENT Provider Note   CSN: [id number] Arrival date & time: 03/06/22  1629     History  Chief Complaint  Patient presents with   Megan [name] is a 88 y.o. female.  Patient is a 88 year old female presenting via EMS for ground-level fall.  Patient states she was moving her trash out to the street when she tripped forward falling and hitting her forehead.  She denies any LOC.  No blood thinner use.  She states she was unable to get up initially and bystanders called EMS.  Currently she admits to contusion to the forehead but otherwise denies any bony pain at this time.  She denies any spinal pain.   The history is provided by the patient. No language interpreter was used.  Fall Pertinent negatives include no chest pain, no abdominal pain and no shortness of breath.       Home Medications Prior to Admission medications   Medication Sig Start Date End Date Taking? Authorizing Provider  aspirin EC 81 MG tablet Take 81 mg by mouth daily with lunch.     [provider]  Calcium-Vitamin D-Vitamin K (CALCIUM + D + K PO) Take 1 tablet by mouth daily with breakfast.    [provider]  HYDROcodone-acetaminophen (NORCO/VICODIN) 5-325 MG tablet Take 1 tablet by mouth every 4 (four) hours as needed for moderate pain ((score 4 to 6)). 09/03/17   Porterfield, Amber, PA-C  methocarbamol (ROBAXIN) 500 MG tablet Take 1 tablet (500 mg total) by mouth every 6 (six) hours as needed for muscle spasms. 09/03/17   Porterfield, Hospital doctor, PA-C  Polyethyl Glycol-Propyl Glycol (SYSTANE OP) Place 1 drop into both eyes daily.    [provider]  vitamin B-12 (CYANOCOBALAMIN) 1000 MCG tablet Take 1,000 mcg by mouth daily with lunch.     [provider]      Allergies    Patient has no known allergies.    Review of Systems   Review of Systems  Constitutional:  Negative for chills and fever.  HENT:  Negative for ear pain  and sore throat.   Eyes:  Negative for pain and visual disturbance.  Respiratory:  Negative for cough and shortness of breath.   Cardiovascular:  Negative for chest pain and palpitations.  Gastrointestinal:  Negative for abdominal pain and vomiting.  Genitourinary:  Negative for dysuria and hematuria.  Musculoskeletal:  Negative for arthralgias and back pain.  Skin:  Positive for wound. Negative for color change and rash.  Neurological:  Negative for seizures and syncope.  All other systems reviewed and are negative.   Physical Exam Updated Vital Signs BP (!) 150/72 (BP Location: Left Arm)   Pulse 79   Temp 98.3 F (36.8 C) (Oral)   Resp 18   Ht 5\' 9"  (1.753 m)   Wt 81.2 kg   SpO2 97%   BMI 26.43 kg/m  Physical Exam Vitals and nursing note reviewed.  Constitutional:      General: She is not in acute distress.    Appearance: She is well-developed.  HENT:     Head: Normocephalic. Raccoon eyes present.   Eyes:     General: Vision grossly intact.     Extraocular Movements: Extraocular movements intact.     Conjunctiva/sclera: Conjunctivae normal.     Pupils: Pupils are equal, round, and reactive to light.  Cardiovascular:     Rate and Rhythm: Normal rate and regular rhythm.  Heart sounds: No murmur heard. Pulmonary:     Effort: Pulmonary effort is normal. No respiratory distress.     Breath sounds: Normal breath sounds.  Abdominal:     Palpations: Abdomen is soft.     Tenderness: There is no abdominal tenderness.  Musculoskeletal:        General: No swelling.     Cervical back: Normal and neck supple.     Thoracic back: Normal.     Lumbar back: Normal.     Comments: No upper extremity or lower extremity bony tenderness, ecchymosis, lacerations, or abrasions.   Skin:    General: Skin is warm and dry.     Capillary Refill: Capillary refill takes less than 2 seconds.     Findings: Laceration present.  Neurological:     General: No focal deficit present.      Mental Status: She is alert and oriented to person, place, and time.     GCS: GCS eye subscore is 4. GCS verbal subscore is 5. GCS motor subscore is 6.     Cranial Nerves: Cranial nerves 2-12 are intact.     Sensory: Sensation is intact.     Motor: Motor function is intact.     Coordination: Coordination is intact.  Psychiatric:        Mood and Affect: Mood normal.     ED Results / Procedures / Treatments   Labs (all labs ordered are listed, but only abnormal results are displayed) Labs Reviewed  CBG MONITORING, ED - Abnormal; Notable for the following components:      Result Value   Glucose-Capillary 166 (*)    All other components within normal limits    EKG None  Radiology CT Head Wo Contrast  Result Date: 03/06/2022 CLINICAL DATA:  Head trauma, minor (Age >= 65y); Facial trauma, blunt; Neck trauma (Age >= 65y) EXAM: CT HEAD WITHOUT CONTRAST CT MAXILLOFACIAL WITHOUT CONTRAST CT CERVICAL SPINE WITHOUT CONTRAST TECHNIQUE: Multidetector CT imaging of the head, cervical spine, and maxillofacial structures were performed using the standard protocol without intravenous contrast. Multiplanar CT image reconstructions of the cervical spine and maxillofacial structures were also generated. RADIATION DOSE REDUCTION: This exam was performed according to the departmental dose-optimization program which includes automated exposure control, adjustment of the mA and/or kV according to patient size and/or use of iterative reconstruction technique. COMPARISON:  None Available. FINDINGS: CT HEAD FINDINGS Brain: No evidence of acute infarction, hemorrhage, hydrocephalus, extra-axial collection or mass lesion/mass effect. Patchy low-density changes within the periventricular and subcortical white matter compatible with chronic microvascular ischemic change. Mild diffuse cerebral volume loss. Vascular: Atherosclerotic calcifications involving the large vessels of the skull base. No unexpected hyperdense  vessel. Skull: Normal. Negative for fracture or focal lesion. Other: Right frontal scalp soft tissue swelling and irregularity, likely a laceration. There is also soft tissue prominence of the scalp overlying the high right parietal region. CT MAXILLOFACIAL FINDINGS Osseous: No acute maxillofacial bone fracture. Bony orbital walls are intact. Mandible intact. Temporomandibular joints are aligned without dislocation. Orbits: Negative. No traumatic or inflammatory finding. Sinuses: Clear. Soft tissues: Negative. CT CERVICAL SPINE FINDINGS Alignment: Facet joints are aligned without dislocation or traumatic listhesis. Dens and lateral masses are aligned. Skull base and vertebrae: No acute fracture. No primary bone lesion or focal pathologic process. Soft tissues and spinal canal: No prevertebral fluid or swelling. No visible canal hematoma. Disc levels: Degenerative disc disease of C5-6 and C6-7. Multilevel facet arthropathy is worse on the right. Upper chest: Negative.  Other: Aortic atherosclerosis. IMPRESSION: 1. No acute intracranial abnormality. 2. Right frontal scalp soft tissue swelling and irregularity. There is also soft tissue prominence of the scalp overlying the high right parietal region. No underlying calvarial fracture. 3. No acute maxillofacial bone fracture. 4. No acute fracture or subluxation of the cervical spine. Aortic Atherosclerosis (ICD10-I70.0). Electronically Signed   By: Duanne Guess D.O.   On: 03/06/2022 19:14   CT Cervical Spine Wo Contrast  Result Date: 03/06/2022 CLINICAL DATA:  Head trauma, minor (Age >= 65y); Facial trauma, blunt; Neck trauma (Age >= 65y) EXAM: CT HEAD WITHOUT CONTRAST CT MAXILLOFACIAL WITHOUT CONTRAST CT CERVICAL SPINE WITHOUT CONTRAST TECHNIQUE: Multidetector CT imaging of the head, cervical spine, and maxillofacial structures were performed using the standard protocol without intravenous contrast. Multiplanar CT image reconstructions of the cervical spine and  maxillofacial structures were also generated. RADIATION DOSE REDUCTION: This exam was performed according to the departmental dose-optimization program which includes automated exposure control, adjustment of the mA and/or kV according to patient size and/or use of iterative reconstruction technique. COMPARISON:  None Available. FINDINGS: CT HEAD FINDINGS Brain: No evidence of acute infarction, hemorrhage, hydrocephalus, extra-axial collection or mass lesion/mass effect. Patchy low-density changes within the periventricular and subcortical white matter compatible with chronic microvascular ischemic change. Mild diffuse cerebral volume loss. Vascular: Atherosclerotic calcifications involving the large vessels of the skull base. No unexpected hyperdense vessel. Skull: Normal. Negative for fracture or focal lesion. Other: Right frontal scalp soft tissue swelling and irregularity, likely a laceration. There is also soft tissue prominence of the scalp overlying the high right parietal region. CT MAXILLOFACIAL FINDINGS Osseous: No acute maxillofacial bone fracture. Bony orbital walls are intact. Mandible intact. Temporomandibular joints are aligned without dislocation. Orbits: Negative. No traumatic or inflammatory finding. Sinuses: Clear. Soft tissues: Negative. CT CERVICAL SPINE FINDINGS Alignment: Facet joints are aligned without dislocation or traumatic listhesis. Dens and lateral masses are aligned. Skull base and vertebrae: No acute fracture. No primary bone lesion or focal pathologic process. Soft tissues and spinal canal: No prevertebral fluid or swelling. No visible canal hematoma. Disc levels: Degenerative disc disease of C5-6 and C6-7. Multilevel facet arthropathy is worse on the right. Upper chest: Negative. Other: Aortic atherosclerosis. IMPRESSION: 1. No acute intracranial abnormality. 2. Right frontal scalp soft tissue swelling and irregularity. There is also soft tissue prominence of the scalp overlying  the high right parietal region. No underlying calvarial fracture. 3. No acute maxillofacial bone fracture. 4. No acute fracture or subluxation of the cervical spine. Aortic Atherosclerosis (ICD10-I70.0). Electronically Signed   By: Duanne Guess D.O.   On: 03/06/2022 19:14   CT Maxillofacial Wo Contrast  Result Date: 03/06/2022 CLINICAL DATA:  Head trauma, minor (Age >= 65y); Facial trauma, blunt; Neck trauma (Age >= 65y) EXAM: CT HEAD WITHOUT CONTRAST CT MAXILLOFACIAL WITHOUT CONTRAST CT CERVICAL SPINE WITHOUT CONTRAST TECHNIQUE: Multidetector CT imaging of the head, cervical spine, and maxillofacial structures were performed using the standard protocol without intravenous contrast. Multiplanar CT image reconstructions of the cervical spine and maxillofacial structures were also generated. RADIATION DOSE REDUCTION: This exam was performed according to the departmental dose-optimization program which includes automated exposure control, adjustment of the mA and/or kV according to patient size and/or use of iterative reconstruction technique. COMPARISON:  None Available. FINDINGS: CT HEAD FINDINGS Brain: No evidence of acute infarction, hemorrhage, hydrocephalus, extra-axial collection or mass lesion/mass effect. Patchy low-density changes within the periventricular and subcortical white matter compatible with chronic microvascular ischemic change. Mild diffuse cerebral  volume loss. Vascular: Atherosclerotic calcifications involving the large vessels of the skull base. No unexpected hyperdense vessel. Skull: Normal. Negative for fracture or focal lesion. Other: Right frontal scalp soft tissue swelling and irregularity, likely a laceration. There is also soft tissue prominence of the scalp overlying the high right parietal region. CT MAXILLOFACIAL FINDINGS Osseous: No acute maxillofacial bone fracture. Bony orbital walls are intact. Mandible intact. Temporomandibular joints are aligned without dislocation.  Orbits: Negative. No traumatic or inflammatory finding. Sinuses: Clear. Soft tissues: Negative. CT CERVICAL SPINE FINDINGS Alignment: Facet joints are aligned without dislocation or traumatic listhesis. Dens and lateral masses are aligned. Skull base and vertebrae: No acute fracture. No primary bone lesion or focal pathologic process. Soft tissues and spinal canal: No prevertebral fluid or swelling. No visible canal hematoma. Disc levels: Degenerative disc disease of C5-6 and C6-7. Multilevel facet arthropathy is worse on the right. Upper chest: Negative. Other: Aortic atherosclerosis. IMPRESSION: 1. No acute intracranial abnormality. 2. Right frontal scalp soft tissue swelling and irregularity. There is also soft tissue prominence of the scalp overlying the high right parietal region. No underlying calvarial fracture. 3. No acute maxillofacial bone fracture. 4. No acute fracture or subluxation of the cervical spine. Aortic Atherosclerosis (ICD10-I70.0). Electronically Signed   By: Duanne Guess D.O.   On: 03/06/2022 19:14    Procedures .Marland KitchenLaceration Repair  Date/Time: 03/06/2022 11:35 PM  Performed by: Franne Forts, DO Authorized by: Franne Forts, DO   Consent:    Consent obtained:  Verbal   Consent given by:  Patient   Risks, benefits, and alternatives were discussed: yes     Risks discussed:  Infection, pain and poor cosmetic result   Alternatives discussed:  No treatment Universal protocol:    Immediately prior to procedure, a time out was called: yes     Patient identity confirmed:  Verbally with patient, arm band and hospital-assigned identification number Anesthesia:    Anesthesia method:  Local infiltration Laceration details:    Location:  Scalp   Length (cm):  3 Treatment:    Area cleansed with:  Povidone-iodine   Amount of cleaning:  Standard   Irrigation solution:  Sterile water   Irrigation method:  Syringe Skin repair:    Repair method:  Sutures   Suture technique:   Simple interrupted   Number of sutures:  5 Approximation:    Approximation:  Close Repair type:    Repair type:  Simple Post-procedure details:    Dressing:  Open (no dressing)   Procedure completion:  Tolerated well, no immediate complications     Medications Ordered in ED Medications  acetaminophen (TYLENOL) tablet 650 mg (650 mg Oral Given 03/06/22 1900)  Tdap (BOOSTRIX) injection 0.5 mL (0.5 mLs Intramuscular Given 03/06/22 1902)    ED Course/ Medical Decision Making/ A&P                           Medical Decision Making Amount and/or Complexity of Data Reviewed Radiology: ordered.  Risk OTC drugs. Prescription drug management.     88 year old female presenting via EMS for ground-level fall.  Patient is alert and oriented x 3, no acute distress, afebrile, stable vital signs.  Collar on arrival.  Physical exam demonstrates no neurovascular deficits.  No spinal tenderness.  Patient has a large stellate shaped laceration to the forehead.  No active bleeding at this time.  Wound irrigated and cleaned with normal saline.  Tetanus given.  Using [place]  CT Blunt Head Trauma Rule patient recommended for CT head due to age.   CT head and neck demonstrates no acute process. No intracranial bleeds.   Wound repaired with approximately 5 sutures.  Patient in no distress and overall condition improved here in the ED. Detailed discussions were had with the patient regarding current findings, and need for close f/u with PCP or on call doctor. The patient has been instructed to return immediately if the symptoms worsen in any way for re-evaluation. Patient verbalized understanding and is in agreement with current care plan. All questions answered prior to discharge.         Final Clinical Impression(s) / ED Diagnoses Final diagnoses:  Blunt head trauma, initial encounter  Fall, initial encounter  Laceration of scalp without foreign body, initial encounter    Rx / DC  Orders ED Discharge Orders     None         Franne Forts, DO 03/07/22 1143

## 2022-03-06 NOTE — ED Triage Notes (Addendum)
Patient arrives from home via EMS due to fall. Per bystanders they found patient in the road when attempting to take out her trash. Patient denies blood thinners but does not remember what happened. BP 188/98 HR 105 and 99% on room air. GCS 15. Laceration to forehead.

## 2022-03-13 DIAGNOSIS — Z0000 Encounter for general adult medical examination without abnormal findings: Secondary | ICD-10-CM | POA: Diagnosis not present

## 2022-03-13 DIAGNOSIS — S0181XA Laceration without foreign body of other part of head, initial encounter: Secondary | ICD-10-CM | POA: Diagnosis not present

## 2022-03-13 DIAGNOSIS — R011 Cardiac murmur, unspecified: Secondary | ICD-10-CM | POA: Diagnosis not present

## 2022-03-13 DIAGNOSIS — R739 Hyperglycemia, unspecified: Secondary | ICD-10-CM | POA: Diagnosis not present

## 2022-12-12 DIAGNOSIS — H02102 Unspecified ectropion of right lower eyelid: Secondary | ICD-10-CM | POA: Diagnosis not present

## 2022-12-12 DIAGNOSIS — Z961 Presence of intraocular lens: Secondary | ICD-10-CM | POA: Diagnosis not present
